# Patient Record
Sex: Female | Born: 1990 | Race: Black or African American | Hispanic: No | Marital: Single | Smoking: Never smoker
Health system: Southern US, Community
[De-identification: ages and names within clinical notes are randomized; demographics above are authoritative.]

## PROBLEM LIST (undated history)

## (undated) ENCOUNTER — Emergency Department (HOSPITAL_COMMUNITY): Admission: EM | Payer: Medicaid Other | Source: Home / Self Care

## (undated) DIAGNOSIS — Z8742 Personal history of other diseases of the female genital tract: Secondary | ICD-10-CM

## (undated) DIAGNOSIS — Z8669 Personal history of other diseases of the nervous system and sense organs: Secondary | ICD-10-CM

## (undated) DIAGNOSIS — N83202 Unspecified ovarian cyst, left side: Secondary | ICD-10-CM

## (undated) DIAGNOSIS — N83201 Unspecified ovarian cyst, right side: Secondary | ICD-10-CM

## (undated) DIAGNOSIS — J45909 Unspecified asthma, uncomplicated: Secondary | ICD-10-CM

## (undated) DIAGNOSIS — I2699 Other pulmonary embolism without acute cor pulmonale: Secondary | ICD-10-CM

## (undated) HISTORY — DX: Other pulmonary embolism without acute cor pulmonale: I26.99

## (undated) HISTORY — DX: Personal history of other diseases of the nervous system and sense organs: Z86.69

## (undated) HISTORY — DX: Personal history of other diseases of the female genital tract: Z87.42

---

## 2013-07-08 HISTORY — PX: OVARIAN CYST REMOVAL: SHX89

## 2017-07-22 ENCOUNTER — Encounter (HOSPITAL_COMMUNITY): Payer: Self-pay | Admitting: *Deleted

## 2017-07-22 ENCOUNTER — Other Ambulatory Visit: Payer: Self-pay

## 2017-07-22 ENCOUNTER — Emergency Department (HOSPITAL_COMMUNITY): Payer: Medicaid Other

## 2017-07-22 ENCOUNTER — Emergency Department (HOSPITAL_COMMUNITY)
Admission: EM | Admit: 2017-07-22 | Discharge: 2017-07-22 | Disposition: A | Discharge status: Home / Self Care | Payer: Medicaid Other | Attending: Emergency Medicine | Admitting: Emergency Medicine

## 2017-07-22 DIAGNOSIS — Y999 Unspecified external cause status: Secondary | ICD-10-CM | POA: Insufficient documentation

## 2017-07-22 DIAGNOSIS — Y9301 Activity, walking, marching and hiking: Secondary | ICD-10-CM | POA: Insufficient documentation

## 2017-07-22 DIAGNOSIS — S8001XA Contusion of right knee, initial encounter: Secondary | ICD-10-CM | POA: Diagnosis not present

## 2017-07-22 DIAGNOSIS — Y929 Unspecified place or not applicable: Secondary | ICD-10-CM | POA: Diagnosis not present

## 2017-07-22 DIAGNOSIS — S8991XA Unspecified injury of right lower leg, initial encounter: Secondary | ICD-10-CM | POA: Diagnosis present

## 2017-07-22 DIAGNOSIS — X509XXA Other and unspecified overexertion or strenuous movements or postures, initial encounter: Secondary | ICD-10-CM | POA: Insufficient documentation

## 2017-07-22 NOTE — ED Notes (Signed)
ED Provider at bedside. 

## 2017-07-22 NOTE — ED Notes (Signed)
Patient transported to MRI 

## 2017-07-22 NOTE — Discharge Instructions (Signed)
Use ice 3-4 times daily alternating 20 minutes on, 20 minutes off.  Keep your leg elevated whenever you are not walking on it.  Please follow-up with Dr. August Saucerean as soon as possible for further evaluation and treatment of your injury.  You can take ibuprofen and Tylenol as prescribed over-the-counter, alternating as needed for pain.  Wear the knee immobilizer as needed for comfort.  Please return to emergency department if you develop any new or worsening symptoms.

## 2017-07-22 NOTE — Consult Note (Signed)
Reason for Consult:Right knee pain Referring Physician: E Aliene AltesWentz  Debra Bell is an 27 y.o. female.  HPI: Debra Bell was going up a flight of stairs 2 at a time when she missed the last one and fell onto her right knee on the landing. This was on Saturday 10d ago. It originally didn't hurt a lot but as time went on it began to hurt more, swell, and bruise. She sought care today because her husband said she should. She has been able to walk on it.  History reviewed. No pertinent past medical history.  History reviewed. No pertinent surgical history.  No family history on file.  Social History:  has no tobacco, alcohol, and drug history on file.  Allergies: No Known Allergies  Medications: I have reviewed the patient's current medications.  No results found for this or any previous visit (from the past 48 hour(s)).  Dg Tibia/fibula Right  Result Date: 07/22/2017 CLINICAL DATA:  Fall.  Swelling of the proximal lower leg. EXAM: RIGHT TIBIA AND FIBULA - 2 VIEW COMPARISON:  Right knee x-rays from same date. FINDINGS: There is no evidence of fracture or other focal bone lesions. Mild soft tissue swelling along the anterior proximal lower leg. IMPRESSION: Mild soft tissue swelling along the anterior proximal lower leg. No acute osseous abnormality. Electronically Signed   By: Obie DredgeWilliam T Derry M.D.   On: 07/22/2017 10:34   Dg Knee Complete 4 Views Right  Result Date: 07/22/2017 CLINICAL DATA:  Onset of right anterior knee pain 9 days ago at which time the patient fell and struck the anterior aspect of the knee on the front corner of a stepping stone. EXAM: RIGHT KNEE - COMPLETE 4+ VIEW COMPARISON:  None in PACs FINDINGS: There is an abnormal appearance of the lower pole of the patella. This may reflect an avulsion fracture fracture or fracture through an enthesiophyte. The patella appears appropriately positioned. The patellofemoral joint space is reasonably well-maintained. The distal femur and  proximal tibia and fibula are unremarkable. The medial and lateral joint spaces are well maintained. There is no significant joint effusion. IMPRESSION: The patient has sustained a fracture through the lower pole of the patella or through an enthesiophyte arising from the lower pole of the patella at the origin of the patellar tendon. The remainder of the knee is unremarkable. Electronically Signed   By: David  SwazilandJordan M.D.   On: 07/22/2017 08:54    Review of Systems  Constitutional: Negative for weight loss.  HENT: Negative for ear discharge, ear pain, hearing loss and tinnitus.   Eyes: Negative for blurred vision, double vision, photophobia and pain.  Respiratory: Negative for cough, sputum production and shortness of breath.   Cardiovascular: Negative for chest pain.  Gastrointestinal: Negative for abdominal pain, nausea and vomiting.  Genitourinary: Negative for dysuria, flank pain, frequency and urgency.  Musculoskeletal: Positive for joint pain (Right knee). Negative for back pain, falls, myalgias and neck pain.  Neurological: Negative for dizziness, tingling, sensory change, focal weakness, loss of consciousness and headaches.  Endo/Heme/Allergies: Does not bruise/bleed easily.  Psychiatric/Behavioral: Negative for depression, memory loss and substance abuse. The patient is not nervous/anxious.    Blood pressure 116/71, pulse 86, temperature 97.9 F (36.6 C), temperature source Oral, resp. rate 17, height 5\' 5"  (1.651 m), weight 127 kg (280 lb), last menstrual period 06/30/2017, SpO2 100 %. Physical Exam  Constitutional: She appears well-developed and well-nourished. No distress.  HENT:  Head: Normocephalic.  Eyes: Conjunctivae are normal. Right eye exhibits no  discharge. Left eye exhibits no discharge. No scleral icterus.  Neck: Normal range of motion.  Cardiovascular: Normal rate and regular rhythm.  Respiratory: Effort normal. No respiratory distress.  Musculoskeletal:  RLE No  traumatic wounds or rash, extensive ecchymoses inferomedial to knee with dependent ecchymoses down leg.  Mod-to-severe TTP med/lat joint lines, mild TTP patella  No obvious knee or ankle effusion but body habitus makes assessment difficult  Knee stable to varus/ valgus and anterior/posterior stress but limited by pain  Sens DPN, SPN, TN intact  Motor EHL, ext, flex, evers 5/5  DP 2+, PT 2+, No significant edema  Neurological: She is alert.  Skin: Skin is warm and dry. She is not diaphoretic.  Psychiatric: She has a normal mood and affect. Her behavior is normal.    Assessment/Plan: Right knee pain -- Given history and x-ray doubt that represents a true patella fx but I do think something is going on in her knee. Will get MRI and assess from there.    Freeman Caldron, PA-C Orthopedic Surgery [phone] 07/22/2017, 11:41 AM

## 2017-07-22 NOTE — ED Provider Notes (Signed)
[facility] EMERGENCY DEPARTMENT Provider Note   CSN: [id number] Arrival date & time: 07/22/17  0759     History   Chief Complaint Chief Complaint  Patient presents with  . Leg Injury    HPI Debra Bell is a 27 y.o. female who is previously healthy who presents with right knee and lower leg pain after falling almost a week ago.  She reports she tripped on a step and hit her knee on the step edge.  She denied her head or lose consciousness.  She developed worsening pain, bruising, and swelling to her leg a couple days after the injury.  She has been ambulating on the leg with mild to moderate pain.  She has had some associated tingling in the leg.  She denies any other associated symptoms.  HPI  History reviewed. No pertinent past medical history.  There are no active problems to display for this patient.   History reviewed. No pertinent surgical history.  OB History    No data available       Home Medications    Prior to Admission medications   Not on File    Family History No family history on file.  Social History Social History   Tobacco Use  . Smoking status: Not on file  Substance Use Topics  . Alcohol use: Not on file  . Drug use: Not on file     Allergies   Patient has no known allergies.   Review of Systems Review of Systems  Musculoskeletal: Positive for arthralgias (R knee and lower leg).  Skin: Positive for color change.  Neurological: Positive for numbness (paresthesia).     Physical Exam Updated Vital Signs BP 104/62 (BP Location: Right Arm)   Pulse 78   Temp 97.9 F (36.6 C) (Oral)   Resp 18   Ht 5\' 5"  (1.651 m)   Wt 127 kg (280 lb)   LMP 06/30/2017   SpO2 100%   BMI 46.59 kg/m   Physical Exam  Constitutional: She appears well-developed and well-nourished. No distress.  HENT:  Head: Normocephalic and atraumatic.  Mouth/Throat: Oropharynx is clear and moist. No oropharyngeal exudate.  Eyes:  Conjunctivae are normal. Pupils are equal, round, and reactive to light. Right eye exhibits no discharge. Left eye exhibits no discharge. No scleral icterus.  Neck: Normal range of motion. Neck supple. No thyromegaly present.  Cardiovascular: Normal rate, regular rhythm, normal heart sounds and intact distal pulses. Exam reveals no gallop and no friction rub.  No murmur heard. Pulmonary/Chest: Effort normal and breath sounds normal. No stridor. No respiratory distress. She has no wheezes. She has no rales.  Abdominal: Soft. Bowel sounds are normal.  Musculoskeletal: She exhibits no edema.  R knee: Significant ecchymosis noted inferior to right knee with indurated hematoma to the medial aspect just inferior to the knee; some crepitus noted to palpation of the patella with some associated tenderness; ecchymosis extends to the ankle; tenderness to the anterior ankle and anterior lower leg, but no tenderness to bilateral malleoli; sensation is intact; range of motion intact; no laxity noted with varus and valgus stress; negative anterior and posterior drawer test  Lymphadenopathy:    She has no cervical adenopathy.  Neurological: She is alert. Coordination normal.  Skin: Skin is warm and dry. No rash noted. She is not diaphoretic. No pallor.  Psychiatric: She has a normal mood and affect.  Nursing note and vitals reviewed.    ED Treatments / Results  Labs (all  labs ordered are listed, but only abnormal results are displayed) Labs Reviewed - No data to display  EKG  EKG Interpretation None       Radiology Dg Tibia/fibula Right  Result Date: 07/22/2017 CLINICAL DATA:  Fall.  Swelling of the proximal lower leg. EXAM: RIGHT TIBIA AND FIBULA - 2 VIEW COMPARISON:  Right knee x-rays from same date. FINDINGS: There is no evidence of fracture or other focal bone lesions. Mild soft tissue swelling along the anterior proximal lower leg. IMPRESSION: Mild soft tissue swelling along the anterior  proximal lower leg. No acute osseous abnormality. Electronically Signed   By: Obie Dredge M.D.   On: 07/22/2017 10:34   Mr Knee Right Wo Contrast  Result Date: 07/22/2017 CLINICAL DATA:  Right knee injury on stairs 07/12/2017 with a possible fracture. EXAM: MRI OF THE RIGHT KNEE WITHOUT CONTRAST TECHNIQUE: Multiplanar, multisequence MR imaging of the knee was performed. No intravenous contrast was administered. COMPARISON:  None. FINDINGS: MENISCI Medial meniscus:  Intact. Lateral meniscus:  Intact. LIGAMENTS Cruciates:  Intact. Collaterals:  Intact. CARTILAGE Patellofemoral:  Normal. Medial:  Normal. Lateral:  Normal. Joint:  No effusion. Popliteal Fossa:  No Baker's cyst. Extensor Mechanism: Intact. An ossific fragment is seen in the superior fibers of the patellar tendon consistent with remote tendinopathy or injury. Bones:  No fracture. Other: Subcutaneous edema is seen anterior to the patellar tendon and proximal tibia consistent with contusion. IMPRESSION: Negative for fracture. Ossification in the superior fibers of the patellar tendon is most consistent with remote tendinopathy or injury and accounts for finding on comparison plain films. Subcutaneous edema anterior to the patellar tendon and proximal tibia consistent with contusion. Negative for meniscal or ligament tear. Electronically Signed   By: Drusilla Kanner M.D.   On: 07/22/2017 15:07   Dg Knee Complete 4 Views Right  Result Date: 07/22/2017 CLINICAL DATA:  Onset of right anterior knee pain 9 days ago at which time the patient fell and struck the anterior aspect of the knee on the front corner of a stepping stone. EXAM: RIGHT KNEE - COMPLETE 4+ VIEW COMPARISON:  None in PACs FINDINGS: There is an abnormal appearance of the lower pole of the patella. This may reflect an avulsion fracture fracture or fracture through an enthesiophyte. The patella appears appropriately positioned. The patellofemoral joint space is reasonably  well-maintained. The distal femur and proximal tibia and fibula are unremarkable. The medial and lateral joint spaces are well maintained. There is no significant joint effusion. IMPRESSION: The patient has sustained a fracture through the lower pole of the patella or through an enthesiophyte arising from the lower pole of the patella at the origin of the patellar tendon. The remainder of the knee is unremarkable. Electronically Signed   By: David  [place] M.D.   On: 07/22/2017 08:54    Procedures Procedures (including critical care time)  Medications Ordered in ED Medications - No data to display   Initial Impression / Assessment and Plan / ED Course  I have reviewed the triage vital signs and the nursing notes.  Pertinent labs & imaging results that were available during my care of the patient were reviewed by me and considered in my medical decision making (see chart for details).     Patient with right knee pain after direct injury about 1 week ago.  X-ray showed fracture through the lower pole of the patella or through enthesophyte arising from the lower pole.  Otherwise, x-ray unremarkable.  I consulted Earney Hamburg, orthopedic  PA, who evaluate the patient and recommended MRI while patient was in the department.  MRI shows no fracture, but ossification in the superior fibers of the patellar tendon most consistent with remote tendinopathy or injury and accounts for finding on the comparison x-ray; subcutaneous edema anterior to the patellar tendon and proximal tibia consistent with contusion; negative for meniscal or ligament tear.  Patient will be placed in knee immobilizer for comfort with weightbearing as tolerated.  Follow-up to orthopedics for further management.  Supportive treatment discussed including ice, elevation, ibuprofen and Tylenol.  Return precautions discussed.  Patient understands and agrees with plan.  Patient vitals stable throughout ED course and discharged in  satisfactory condition.  Final Clinical Impressions(s) / ED Diagnoses   Final diagnoses:  Contusion of right knee, initial encounter    ED Discharge Orders    None       Emi HolesLaw, Javel Hersh M, PA-C 07/22/17 1741    Mancel BaleWentz, Elliott, MD 07/23/17 [phone]

## 2017-07-22 NOTE — ED Triage Notes (Signed)
Pt reports tripping and hitting her leg on Sunday. Pt states that she has a bruise, swelling  and knot to her rt knee and leg. Pt states that she is able to bear weight but with pain.

## 2017-12-18 ENCOUNTER — Encounter (HOSPITAL_COMMUNITY): Payer: Self-pay | Admitting: Emergency Medicine

## 2017-12-18 ENCOUNTER — Ambulatory Visit (HOSPITAL_COMMUNITY)
Admission: EM | Admit: 2017-12-18 | Discharge: 2017-12-18 | Disposition: A | Discharge status: Home / Self Care | Payer: Medicaid Other | Attending: Family Medicine | Admitting: Family Medicine

## 2017-12-18 ENCOUNTER — Ambulatory Visit (INDEPENDENT_AMBULATORY_CARE_PROVIDER_SITE_OTHER): Payer: Medicaid Other

## 2017-12-18 DIAGNOSIS — J22 Unspecified acute lower respiratory infection: Secondary | ICD-10-CM | POA: Insufficient documentation

## 2017-12-18 DIAGNOSIS — R05 Cough: Secondary | ICD-10-CM | POA: Diagnosis present

## 2017-12-18 LAB — POCT RAPID STREP A: Streptococcus, Group A Screen (Direct): NEGATIVE

## 2017-12-18 MED ORDER — AZITHROMYCIN 250 MG PO TABS: ORAL_TABLET | ORAL | 0 refills | Status: AC

## 2017-12-18 MED ORDER — IBUPROFEN 400 MG PO TABS: 400.0000 mg | ORAL_TABLET | Freq: Four times a day (QID) | ORAL | 0 refills | Status: DC | PRN

## 2017-12-18 MED ORDER — ACETAMINOPHEN 325 MG PO TABS
ORAL_TABLET | ORAL | Status: AC
  Filled 2017-12-18: qty 1

## 2017-12-18 MED ORDER — ACETAMINOPHEN 325 MG PO TABS
650.0000 mg | ORAL_TABLET | Freq: Once | ORAL | Status: AC
  Administered 2017-12-18: 650 mg via ORAL

## 2017-12-18 NOTE — ED Triage Notes (Signed)
Pt c/o cough, blood in her  Sputum. Since yesterday.

## 2017-12-18 NOTE — Discharge Instructions (Signed)
Push fluids to ensure adequate hydration and keep secretions thin.  Tylenol and/or ibuprofen as needed for pain or fevers.  Complete course of antibiotics.  Over the counter treatments as needed for symptoms. If symptoms worsen or do not improve in the next week to return to be seen or to follow up with your PCP.

## 2017-12-18 NOTE — ED Provider Notes (Signed)
[facility]    CSN: [id number] Arrival date & time: 12/18/17  1019     History   Chief Complaint Chief Complaint  Patient presents with  . Cough    HPI Debra Bell is a 27 y.o. female.   Debra Bell presents with complaints of cough, sore throat, fever, nausea, blood production with cough. Started last night with cough and woke this morning worse. No vomiting or diarrhea. Body aches. No ear pain. Took a cough drop but no other medications for symptoms. Chest pain  With cough. Travelled to [place] 4 weeks ago, otherwise no recent travel. States past history of asthma but has not had an attack in approximately 3 years. No rash. Her son has had URI. LMP 6/5.   ROS per HPI.      History reviewed. No pertinent past medical history.  There are no active problems to display for this patient.   History reviewed. No pertinent surgical history.  OB History   None      Home Medications    Prior to Admission medications   Medication Sig Start Date End Date Taking? Authorizing Provider  azithromycin (ZITHROMAX) 250 MG tablet Take 2 tablets (500 mg total) by mouth daily for 1 day, THEN 1 tablet (250 mg total) daily for 4 days. 12/18/17 12/23/17  Georgetta HaberBurky, Tykee Heideman B, NP  ibuprofen (ADVIL,MOTRIN) 400 MG tablet Take 1 tablet (400 mg total) by mouth every 6 (six) hours as needed. 12/18/17   Georgetta HaberBurky, Natoria Archibald B, NP    Family History No family history on file.  Social History Social History   Tobacco Use  . Smoking status: Not on file  Substance Use Topics  . Alcohol use: Not on file  . Drug use: Not on file     Allergies   Patient has no known allergies.   Review of Systems Review of Systems   Physical Exam Triage Vital Signs ED Triage Vitals [12/18/17 1106]  Enc Vitals Group     BP 122/85     Pulse Rate (!) 105     Resp 18     Temp (!) 102.3 F (39.1 C)     Temp Source Oral     SpO2 98 %     Weight      Height      Head Circumference      Peak Flow    Pain Score      Pain Loc      Pain Edu?      Excl. in GC?    No data found.  Updated Vital Signs BP 122/85   Pulse (!) 105   Temp (!) 102.3 F (39.1 C) (Oral)   Resp 18   LMP 12/10/2017   SpO2 98%    Physical Exam  Constitutional: She is oriented to person, place, and time. She appears well-developed and well-nourished. No distress.  HENT:  Head: Normocephalic and atraumatic.  Right Ear: External ear and ear canal normal.  Left Ear: Tympanic membrane, external ear and ear canal normal.  Nose: Nose normal.  Mouth/Throat: Uvula is midline, oropharynx is clear and moist and mucous membranes are normal. Tonsils are 1+ on the right. Tonsils are 1+ on the left. Tonsillar exudate.  Right ear with large cerumen   Eyes: Pupils are equal, round, and reactive to light. Conjunctivae and EOM are normal.  Cardiovascular: Normal rate, regular rhythm and normal heart sounds.  Pulmonary/Chest: Effort normal and breath sounds normal.  Abdominal: Soft. There is no tenderness.  Neurological: She is alert and oriented to person, place, and time.  Skin: Skin is warm and dry.     UC Treatments / Results  Labs (all labs ordered are listed, but only abnormal results are displayed) Labs Reviewed  CULTURE, GROUP A STREP [facility])  POCT RAPID STREP A    EKG None  Radiology Dg Chest 2 View  Result Date: 12/18/2017 CLINICAL DATA:  27 year old female with sore throat and cough onset last night. Blood tinged sputum this morning. Fever 102.3, tachycardia. EXAM: CHEST - 2 VIEW COMPARISON:  None. FINDINGS: Low normal lung volumes. Normal cardiac size and mediastinal contours. Visualized tracheal air column is within normal limits. No pneumothorax, pulmonary edema, pleural effusion or confluent pulmonary opacity. Borderline to mild increased pulmonary interstitial markings, might reflect crowding. No osseous abnormality identified. Negative visible bowel gas pattern. IMPRESSION: Negative aside from  borderline to mild increased interstitial markings which could reflect atelectasis, but consider viral/atypical respiratory infection. Electronically Signed   By: Odessa Fleming M.D.   On: 12/18/2017 11:35    Procedures Procedures (including critical care time)  Medications Ordered in UC Medications  acetaminophen (TYLENOL) tablet 650 mg (650 mg Oral Given 12/18/17 1110)    Initial Impression / Assessment and Plan / UC Course  I have reviewed the triage vital signs and the nursing notes.  Pertinent labs & imaging results that were available during my care of the patient were reviewed by me and considered in my medical decision making (see chart for details).     Xray with atypical vs viral findings. Patient febrile with chest pain  And cough. Will treat empirically with azithromycin at this time. Discussed results vs viral illness with patient. Continue with supportive cares. Return precautions provided. Patient verbalized understanding and agreeable to plan.  Ambulatory out of clinic without difficulty.    Final Clinical Impressions(s) / UC Diagnoses   Final diagnoses:  Lower respiratory tract infection     Discharge Instructions     Push fluids to ensure adequate hydration and keep secretions thin.  Tylenol and/or ibuprofen as needed for pain or fevers.  Complete course of antibiotics.  Over the counter treatments as needed for symptoms. If symptoms worsen or do not improve in the next week to return to be seen or to follow up with your PCP.      ED Prescriptions    Medication Sig Dispense Auth. Provider   azithromycin (ZITHROMAX) 250 MG tablet Take 2 tablets (500 mg total) by mouth daily for 1 day, THEN 1 tablet (250 mg total) daily for 4 days. 6 tablet Linus Mako B, NP   ibuprofen (ADVIL,MOTRIN) 400 MG tablet Take 1 tablet (400 mg total) by mouth every 6 (six) hours as needed. 30 tablet Georgetta Haber, NP     Controlled Substance Prescriptions [place] Controlled Substance  Registry consulted? Not Applicable   Georgetta Haber, NP 12/18/17 1150

## 2017-12-20 ENCOUNTER — Telehealth (HOSPITAL_COMMUNITY): Payer: Self-pay

## 2017-12-20 LAB — CULTURE, GROUP A STREP (THRC)

## 2017-12-20 NOTE — Telephone Encounter (Signed)
Culture is positive for non group A Strep germ.  This is a finding of uncertain significance; not the typical 'strep throat' germ.  Attempted to reach patient. No answer at this time.  

## 2018-03-14 ENCOUNTER — Ambulatory Visit (HOSPITAL_COMMUNITY)
Admission: EM | Admit: 2018-03-14 | Discharge: 2018-03-14 | Disposition: A | Discharge status: Home / Self Care | Payer: Medicaid Other | Attending: Internal Medicine | Admitting: Internal Medicine

## 2018-03-14 ENCOUNTER — Encounter (HOSPITAL_COMMUNITY): Payer: Self-pay | Admitting: *Deleted

## 2018-03-14 DIAGNOSIS — G43019 Migraine without aura, intractable, without status migrainosus: Secondary | ICD-10-CM | POA: Diagnosis not present

## 2018-03-14 HISTORY — DX: Unspecified asthma, uncomplicated: J45.909

## 2018-03-14 MED ORDER — DEXAMETHASONE SODIUM PHOSPHATE 10 MG/ML IJ SOLN
INTRAMUSCULAR | Status: AC
  Filled 2018-03-14: qty 1

## 2018-03-14 MED ORDER — METOCLOPRAMIDE HCL 5 MG/ML IJ SOLN
INTRAMUSCULAR | Status: AC
  Filled 2018-03-14: qty 2

## 2018-03-14 MED ORDER — DIPHENHYDRAMINE HCL 50 MG/ML IJ SOLN
25.0000 mg | Freq: Once | INTRAMUSCULAR | Status: AC
  Administered 2018-03-14: 25 mg via INTRAMUSCULAR

## 2018-03-14 MED ORDER — METOCLOPRAMIDE HCL 5 MG/ML IJ SOLN
5.0000 mg | Freq: Once | INTRAMUSCULAR | Status: AC
  Administered 2018-03-14: 5 mg via INTRAMUSCULAR

## 2018-03-14 MED ORDER — KETOROLAC TROMETHAMINE 60 MG/2ML IM SOLN
60.0000 mg | Freq: Once | INTRAMUSCULAR | Status: AC
  Administered 2018-03-14: 60 mg via INTRAMUSCULAR

## 2018-03-14 MED ORDER — DIPHENHYDRAMINE HCL 50 MG/ML IJ SOLN
INTRAMUSCULAR | Status: AC
  Filled 2018-03-14: qty 1

## 2018-03-14 MED ORDER — DEXAMETHASONE SODIUM PHOSPHATE 10 MG/ML IJ SOLN
10.0000 mg | Freq: Once | INTRAMUSCULAR | Status: AC
  Administered 2018-03-14: 10 mg via INTRAMUSCULAR

## 2018-03-14 MED ORDER — KETOROLAC TROMETHAMINE 60 MG/2ML IM SOLN
INTRAMUSCULAR | Status: AC
  Filled 2018-03-14: qty 2

## 2018-03-14 NOTE — ED Triage Notes (Signed)
Pt states she has a headache that started Wednesday night. Patient states that her headache is aggravated by light and eye movement.

## 2018-03-14 NOTE — Discharge Instructions (Signed)
It was nice meeting you!!  We will treat with a migraine cocktail.  Follow up as needed for continued or worsening symptoms

## 2018-03-16 NOTE — ED Provider Notes (Addendum)
[facility]    CSN: [id number] Arrival date & time: 03/14/18  1749     History   Chief Complaint Chief Complaint  Patient presents with  . Headache    HPI Debra Bell is a 27 y.o. female.    Headache  Pain location:  L parietal Quality:  Dull (pulsating) Radiates to:  Does not radiate Severity at highest:  10/10 Onset quality:  Gradual Duration:  4 days Timing:  Constant Progression:  Unchanged Chronicity:  Recurrent Similar to prior headaches: yes   Context: activity, bright light and loud noise   Relieved by:  Resting in a darkened room (sleep but returns every morning upon wakening. ) Worsened by:  Light, activity and sound Associated symptoms: blurred vision, nausea, photophobia and visual change   Associated symptoms: no congestion, no cough, no dizziness, no facial pain, no fever, no focal weakness, no neck stiffness, no numbness, no paresthesias, no sinus pressure, no vomiting and no weakness     Past Medical History:  Diagnosis Date  . Asthma     There are no active problems to display for this patient.   Past Surgical History:  Procedure Laterality Date  . OVARIAN CYST REMOVAL  2015   and 2013    OB History   None      Home Medications    Prior to Admission medications   Medication Sig Start Date End Date Taking? Authorizing Provider  aspirin-acetaminophen-caffeine (EXCEDRIN MIGRAINE) [phone] MG tablet Take 2 tablets by mouth every 6 (six) hours as needed for headache.   Yes Emergency, Nurse, RN  ibuprofen (ADVIL,MOTRIN) 400 MG tablet Take 1 tablet (400 mg total) by mouth every 6 (six) hours as needed. 12/18/17   Georgetta Haber, NP    Family History History reviewed. No pertinent family history.  Social History Social History   Tobacco Use  . Smoking status: Never Smoker  . Smokeless tobacco: Never Used  Substance Use Topics  . Alcohol use: Not on file  . Drug use: Not on file     Allergies   Patient has no  known allergies.   Review of Systems Review of Systems  Constitutional: Negative for fever.  HENT: Negative for congestion and sinus pressure.   Eyes: Positive for blurred vision and photophobia.  Respiratory: Negative for cough.   Gastrointestinal: Positive for nausea. Negative for vomiting.  Musculoskeletal: Negative for neck stiffness.  Neurological: Positive for headaches. Negative for dizziness, focal weakness, weakness, numbness and paresthesias.     Physical Exam Triage Vital Signs ED Triage Vitals  Enc Vitals Group     BP 03/14/18 1817 108/69     Pulse Rate 03/14/18 1814 75     Resp 03/14/18 1814 18     Temp 03/14/18 1814 98 F (36.7 C)     Temp Source 03/14/18 1814 Oral     SpO2 03/14/18 1814 100 %     Weight --      Height --      Head Circumference --      Peak Flow --      Pain Score 03/14/18 1814 5     Pain Loc --      Pain Edu? --      Excl. in GC? --    No data found.  Updated Vital Signs BP 108/69   Pulse 75   Temp 98 F (36.7 C) (Oral)   Resp 18   LMP 02/23/2018   SpO2 100%   Visual Acuity  Right Eye Distance:   Left Eye Distance:   Bilateral Distance:    Right Eye Near:   Left Eye Near:    Bilateral Near:     Physical Exam  Constitutional: She is oriented to person, place, and time. She appears well-developed and well-nourished.  Very pleasant. Non toxic or ill appearing.  Appears to be in pain  HENT:  Head: Normocephalic and atraumatic.  Eyes: Pupils are equal, round, and reactive to light. EOM are normal. Right eye exhibits no nystagmus. Left eye exhibits no nystagmus.  Neck: Normal range of motion.  Pulmonary/Chest: Effort normal.  Musculoskeletal: Normal range of motion.  Lymphadenopathy:    She has no cervical adenopathy.  Neurological: She is alert and oriented to person, place, and time. She has normal strength. She is not disoriented. No cranial nerve deficit or sensory deficit.  No focal neuro deficits. Cranial nerves  intact. Strength 5/5 in all extremities.   Skin: Skin is warm and dry. Capillary refill takes less than 2 seconds.  Psychiatric: She has a normal mood and affect.  Nursing note and vitals reviewed.    UC Treatments / Results  Labs (all labs ordered are listed, but only abnormal results are displayed) Labs Reviewed - No data to display  EKG None  Radiology No results found.  Procedures Procedures (including critical care time)  Medications Ordered in UC Medications  ketorolac (TORADOL) injection 60 mg (60 mg Intramuscular Given 03/14/18 1901)  metoCLOPramide (REGLAN) injection 5 mg (5 mg Intramuscular Given 03/14/18 1901)  dexamethasone (DECADRON) injection 10 mg (10 mg Intramuscular Given 03/14/18 1902)  diphenhydrAMINE (BENADRYL) injection 25 mg (25 mg Intramuscular Given 03/14/18 1901)    Initial Impression / Assessment and Plan / UC Course  I have reviewed the triage vital signs and the nursing notes.  Pertinent labs & imaging results that were available during my care of the patient were reviewed by me and considered in my medical decision making (see chart for details).     Migraine x 4 days- hx of same but usually doesn't last this long.  Goes away with sleeping but returns Will treat with migraine cocktail.  Follow up as needed for continued or worsening symptoms  Final Clinical Impressions(s) / UC Diagnoses   Final diagnoses:  Intractable migraine without aura and without status migrainosus     Discharge Instructions     It was nice meeting you!!  We will treat with a migraine cocktail.  Follow up as needed for continued or worsening symptoms      ED Prescriptions    None     Controlled Substance Prescriptions [place] Controlled Substance Registry consulted? no   Janace Aris, NP 03/16/18 1857    Janace Aris, NP 03/16/18 1904

## 2019-02-26 IMAGING — CR DG TIBIA/FIBULA 2V*R*
3 series · 3 of 3 positions shown · non-contrast
Comparison: Right knee x-rays from same date.

CLINICAL DATA: Fall.  Swelling of the proximal lower leg.

EXAM:
RIGHT TIBIA AND FIBULA - 2 VIEW

[tibia ap (1 of 2)]
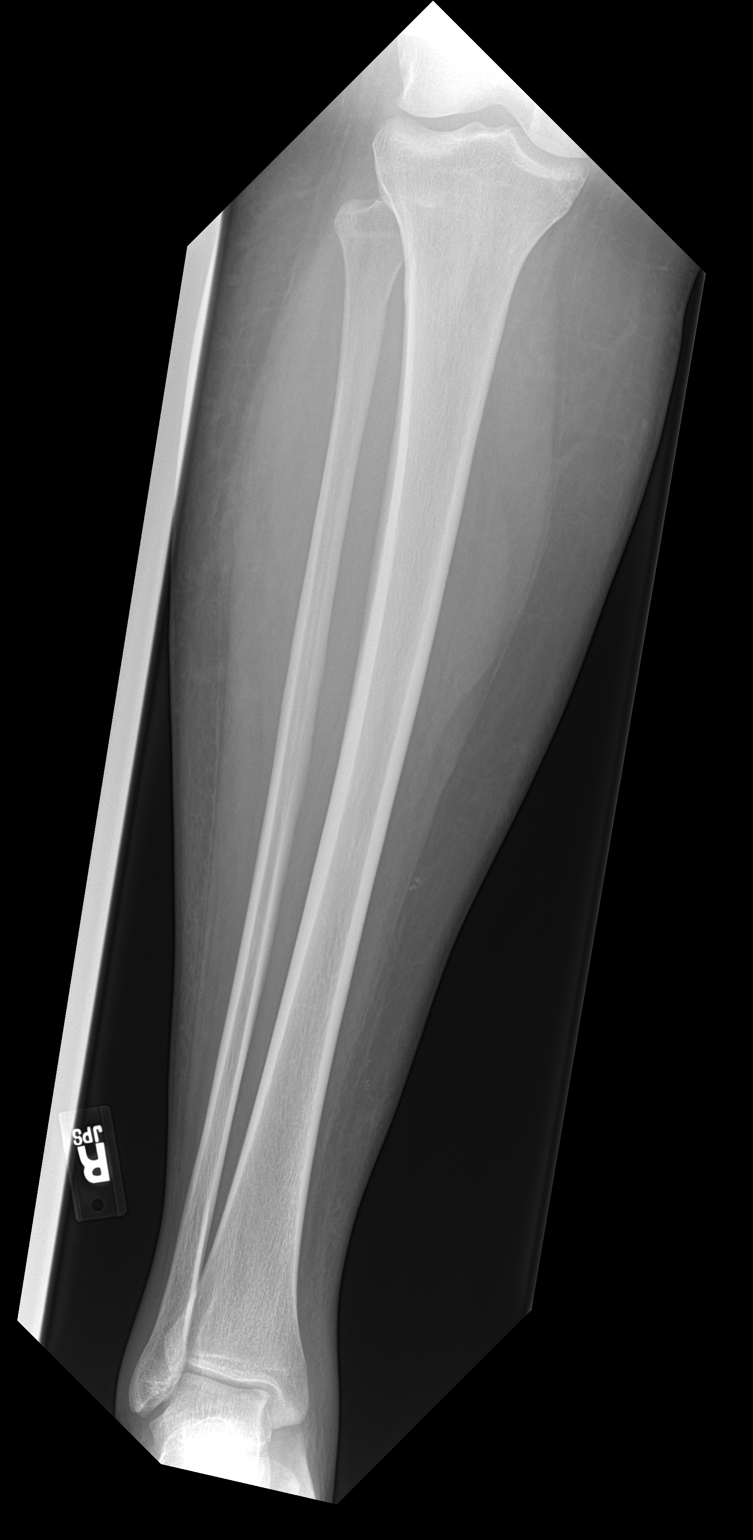

[tibia ap (2 of 2)]
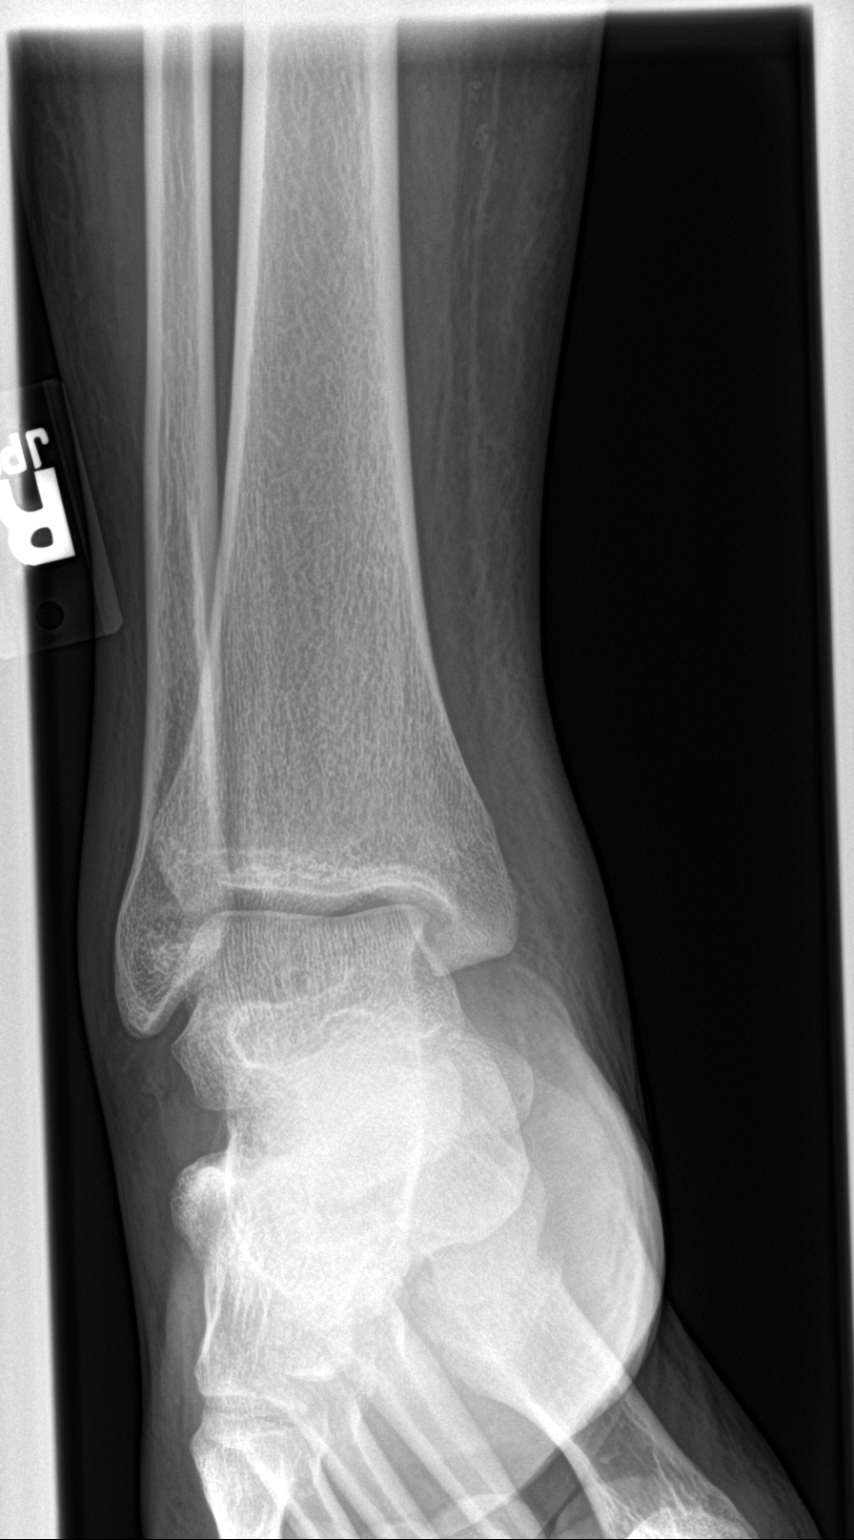

[tibia lat]
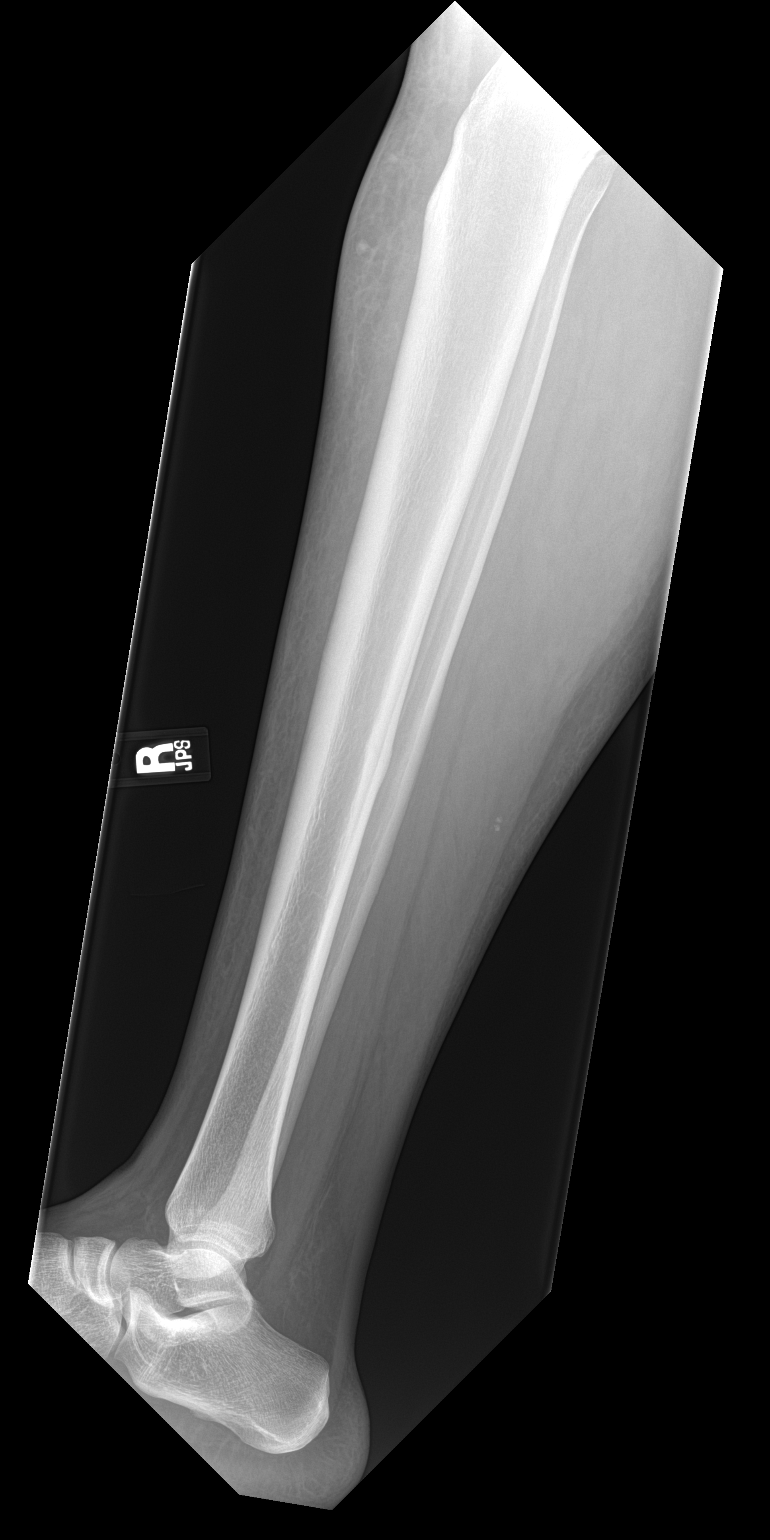

[3 of 3 positions shown; findings below may reference images not displayed]

FINDINGS: There is no evidence of fracture or other focal bone lesions. Mild
soft tissue swelling along the anterior proximal lower leg.
IMPRESSION: Mild soft tissue swelling along the anterior proximal lower leg. No
acute osseous abnormality.

## 2019-07-16 ENCOUNTER — Other Ambulatory Visit: Payer: Self-pay

## 2019-07-16 ENCOUNTER — Encounter (HOSPITAL_COMMUNITY): Payer: Self-pay | Admitting: Family Medicine

## 2019-07-16 ENCOUNTER — Ambulatory Visit (HOSPITAL_COMMUNITY)
Admission: EM | Admit: 2019-07-16 | Discharge: 2019-07-16 | Disposition: A | Discharge status: Home / Self Care | Payer: Medicaid Other | Attending: Family Medicine | Admitting: Family Medicine

## 2019-07-16 DIAGNOSIS — M436 Torticollis: Secondary | ICD-10-CM

## 2019-07-16 MED ORDER — PREDNISONE 50 MG PO TABS: ORAL_TABLET | ORAL | 0 refills | Status: DC

## 2019-07-16 MED ORDER — OXYCODONE-ACETAMINOPHEN 5-325 MG PO TABS: 1.0000 | ORAL_TABLET | Freq: Three times a day (TID) | ORAL | 0 refills | Status: DC | PRN

## 2019-07-16 NOTE — ED Provider Notes (Signed)
[facility]    CSN: [id number] Arrival date & time: 07/16/19  1124      History   Chief Complaint Chief Complaint  Patient presents with  . Neck stiffness    HPI Debra Bell is a 29 y.o. female.   29 yo established [facility] patient presents with stiff neck.  She states she recently got a new bed & her neck has been stiff since last night.   No fever or arm pain.  Pain worse with looking right.  Not working at [employer] right now.     Past Medical History:  Diagnosis Date  . Asthma     There are no problems to display for this patient.   Past Surgical History:  Procedure Laterality Date  . OVARIAN CYST REMOVAL  2015   and 2013    OB History   No obstetric history on file.      Home Medications    Prior to Admission medications   Medication Sig Start Date End Date Taking? Authorizing Provider  aspirin-acetaminophen-caffeine (EXCEDRIN MIGRAINE) [phone] MG tablet Take 2 tablets by mouth every 6 (six) hours as needed for headache.    Emergency, Nurse, RN  ibuprofen (ADVIL,MOTRIN) 400 MG tablet Take 1 tablet (400 mg total) by mouth every 6 (six) hours as needed. 12/18/17   Georgetta Haber, NP  oxyCODONE-acetaminophen (PERCOCET/ROXICET) 5-325 MG tablet Take 1 tablet by mouth every 8 (eight) hours as needed for severe pain. 07/16/19   Elvina Sidle, MD  predniSONE (DELTASONE) 50 MG tablet One daily with food 07/16/19   Elvina Sidle, MD    Family History History reviewed. No pertinent family history.  Social History Social History   Tobacco Use  . Smoking status: Never Smoker  . Smokeless tobacco: Never Used  Substance Use Topics  . Alcohol use: Never  . Drug use: Never     Allergies   Patient has no known allergies.   Review of Systems Review of Systems  Musculoskeletal: Positive for neck pain.  Neurological: Negative.   All other systems reviewed and are negative.    Physical Exam Triage Vital Signs ED Triage Vitals  Enc Vitals  Group     BP      Pulse      Resp      Temp      Temp src      SpO2      Weight      Height      Head Circumference      Peak Flow      Pain Score      Pain Loc      Pain Edu?      Excl. in GC?    No data found.  Updated Vital Signs BP 127/90 (BP Location: Right Arm)   Pulse 83   Temp 98.4 F (36.9 C) (Oral)   Resp 18   Wt 104.3 kg   LMP 06/30/2019   SpO2 99%   BMI 38.27 kg/m    Physical Exam Vitals and nursing note reviewed.  Constitutional:      General: She is in acute distress.     Appearance: Normal appearance. She is obese. She is not ill-appearing or toxic-appearing.  HENT:     Head: Normocephalic.  Eyes:     Conjunctiva/sclera: Conjunctivae normal.  Neck:     Comments: Pain with right head rotation more than lift  nontender neck  FROM of arms. Pulmonary:     Effort: Pulmonary effort  is normal.  Musculoskeletal:        General: Normal range of motion.  Skin:    General: Skin is warm and dry.  Neurological:     General: No focal deficit present.     Mental Status: She is alert and oriented to person, place, and time.     Sensory: No sensory deficit.     Motor: No weakness.     Coordination: Coordination normal.  Psychiatric:        Mood and Affect: Mood normal.        Behavior: Behavior normal.        Thought Content: Thought content normal.        Judgment: Judgment normal.      UC Treatments / Results  Labs (all labs ordered are listed, but only abnormal results are displayed) Labs Reviewed - No data to display  EKG   Radiology No results found.  Procedures Procedures (including critical care time)  Medications Ordered in UC Medications - No data to display  Initial Impression / Assessment and Plan / UC Course  I have reviewed the triage vital signs and the nursing notes.  Pertinent labs & imaging results that were available during my care of the patient were reviewed by me and considered in my medical decision making (see  chart for details).     Final Clinical Impressions(s) / UC Diagnoses   Final diagnoses:  Torticollis, acute   Discharge Instructions   None    ED Prescriptions    Medication Sig Dispense Auth. Provider   predniSONE (DELTASONE) 50 MG tablet One daily with food 3 tablet Robyn Haber, MD   oxyCODONE-acetaminophen (PERCOCET/ROXICET) 5-325 MG tablet Take 1 tablet by mouth every 8 (eight) hours as needed for severe pain. 5 tablet Robyn Haber, MD     I have reviewed the PDMP during this encounter.   Robyn Haber, MD 07/16/19 1229

## 2019-07-16 NOTE — ED Triage Notes (Signed)
Pt. States she recently got a new bed & her neck has been stiff since last night.

## 2020-01-06 DIAGNOSIS — Z419 Encounter for procedure for purposes other than remedying health state, unspecified: Secondary | ICD-10-CM | POA: Diagnosis not present

## 2020-02-06 DIAGNOSIS — Z419 Encounter for procedure for purposes other than remedying health state, unspecified: Secondary | ICD-10-CM | POA: Diagnosis not present

## 2020-03-08 DIAGNOSIS — Z419 Encounter for procedure for purposes other than remedying health state, unspecified: Secondary | ICD-10-CM | POA: Diagnosis not present

## 2020-04-07 DIAGNOSIS — Z419 Encounter for procedure for purposes other than remedying health state, unspecified: Secondary | ICD-10-CM | POA: Diagnosis not present

## 2020-05-08 DIAGNOSIS — Z419 Encounter for procedure for purposes other than remedying health state, unspecified: Secondary | ICD-10-CM | POA: Diagnosis not present

## 2020-06-07 DIAGNOSIS — Z419 Encounter for procedure for purposes other than remedying health state, unspecified: Secondary | ICD-10-CM | POA: Diagnosis not present

## 2020-07-08 DIAGNOSIS — Z419 Encounter for procedure for purposes other than remedying health state, unspecified: Secondary | ICD-10-CM | POA: Diagnosis not present

## 2020-07-10 ENCOUNTER — Ambulatory Visit (HOSPITAL_COMMUNITY)
Admission: EM | Admit: 2020-07-10 | Discharge: 2020-07-10 | Disposition: A | Discharge status: Home / Self Care | Payer: Medicaid Other | Attending: Family Medicine | Admitting: Family Medicine

## 2020-07-10 ENCOUNTER — Encounter (HOSPITAL_COMMUNITY): Payer: Self-pay

## 2020-07-10 ENCOUNTER — Other Ambulatory Visit: Payer: Self-pay

## 2020-07-10 DIAGNOSIS — Z20822 Contact with and (suspected) exposure to covid-19: Secondary | ICD-10-CM | POA: Diagnosis not present

## 2020-07-10 DIAGNOSIS — R112 Nausea with vomiting, unspecified: Secondary | ICD-10-CM | POA: Diagnosis not present

## 2020-07-10 DIAGNOSIS — J069 Acute upper respiratory infection, unspecified: Secondary | ICD-10-CM | POA: Diagnosis not present

## 2020-07-10 DIAGNOSIS — Z3201 Encounter for pregnancy test, result positive: Secondary | ICD-10-CM | POA: Diagnosis not present

## 2020-07-10 LAB — POCT URINALYSIS DIPSTICK, ED / UC
Bilirubin Urine: NEGATIVE
Glucose, UA: NEGATIVE mg/dL
Hgb urine dipstick: NEGATIVE
Ketones, ur: NEGATIVE mg/dL
Leukocytes,Ua: NEGATIVE
Nitrite: NEGATIVE
Protein, ur: NEGATIVE mg/dL
Specific Gravity, Urine: >=1.03 (ref 1.005–1.030)
Urobilinogen, UA: 0.2 mg/dL (ref 0.0–1.0)
pH: 6 (ref 5.0–8.0)

## 2020-07-10 LAB — RESP PANEL BY RT-PCR (FLU A&B, COVID) ARPGX2
Influenza A by PCR: NEGATIVE
Influenza B by PCR: NEGATIVE
SARS Coronavirus 2 by RT PCR: NEGATIVE

## 2020-07-10 LAB — POC URINE PREG, ED: Preg Test, Ur: POSITIVE — AB

## 2020-07-10 MED ORDER — SUCRALFATE 1 G PO TABS: 1.0000 g | ORAL_TABLET | Freq: Three times a day (TID) | ORAL | Status: DC

## 2020-07-10 MED ORDER — ONDANSETRON 4 MG PO TBDP: 4.0000 mg | ORAL_TABLET | Freq: Three times a day (TID) | ORAL | 0 refills | Status: DC | PRN

## 2020-07-10 MED ORDER — PANTOPRAZOLE SODIUM 40 MG PO TBEC: 40.0000 mg | DELAYED_RELEASE_TABLET | Freq: Every day | ORAL | Status: DC

## 2020-07-10 NOTE — Discharge Instructions (Signed)
You have been tested for COVID-19 and influenza. If your test returns positive, you will receive a phone call from [facility] regarding your results. Negative test results are not called. Both positive and negative results area always visible on MyChart. If you do not have a MyChart account, sign up instructions are provided in your discharge papers. Please do not hesitate to contact us should you have questions or concerns.

## 2020-07-10 NOTE — ED Triage Notes (Addendum)
Pt in with c/o nasal congestion, cough, diarrhea, vomiting that has been going on since Thursday.  Pt has been around her sister who recently tested positive for the flu  Pt also requesting pregnancy test for late period

## 2020-07-12 NOTE — ED Provider Notes (Signed)
[facility]   [id number] 07/10/20 Arrival Time: 1115  ASSESSMENT & PLAN:  1. Viral URI with cough   2. Non-intractable vomiting with nausea, unspecified vomiting type   3. Positive pregnancy test      Resp panel testing sent. See letter/work note on file for self-isolation guidelines. OTC symptom care as needed.  Meds ordered this encounter  Medications  . ondansetron (ZOFRAN-ODT) 4 MG disintegrating tablet    Sig: Take 1 tablet (4 mg total) by mouth every 8 (eight) hours as needed for nausea or vomiting.    Dispense:  15 tablet    Refill:  0   Has planned OB f/u.    Follow-up Information    Leilani Able, MD.   Specialty: Family Medicine Why: As needed. Contact information: [address] [place] [address] [phone]               Reviewed expectations re: course of current medical issues. Questions answered. Outlined signs and symptoms indicating need for more acute intervention. Understanding verbalized. After Visit Summary given.   SUBJECTIVE: History from: patient. Debra Bell is a 30 y.o. female who presents with worries regarding COVID-19/influenza. Reports possible exp to influenza; mile URI symptoms currently. Denies: fever and difficulty breathing. Normal PO intake with nausea. Requests UPT. Patient's last menstrual period was 05/08/2020 (approximate).     OBJECTIVE:  Vitals:   07/10/20 1215  BP: (!) 153/115  Pulse: 79  Resp: 20  Temp: 98.4 F (36.9 C)  TempSrc: Oral  SpO2: 98%    General appearance: alert; no distress Eyes: PERRLA; EOMI; conjunctiva normal HENT: [place]; AT; with mild nasal congestion Neck: supple  Lungs: speaks full sentences without difficulty; unlabored Extremities: no edema Skin: warm and dry Neurologic: normal gait Psychological: alert and cooperative; normal mood and affect  Labs: Results for orders placed or performed during the hospital encounter of 07/10/20  Resp Panel by RT-PCR (Flu  A&B, Covid) Nasopharyngeal Swab   Specimen: Nasopharyngeal Swab; Nasopharyngeal(NP) swabs in vial transport medium  Result Value Ref Range   SARS Coronavirus 2 by RT PCR NEGATIVE NEGATIVE   Influenza A by PCR NEGATIVE NEGATIVE   Influenza B by PCR NEGATIVE NEGATIVE  POC Urinalysis dipstick  Result Value Ref Range   Glucose, UA NEGATIVE NEGATIVE mg/dL   Bilirubin Urine NEGATIVE NEGATIVE   Ketones, ur NEGATIVE NEGATIVE mg/dL   Specific Gravity, Urine >=1.030 1.005 - 1.030   Hgb urine dipstick NEGATIVE NEGATIVE   pH 6.0 5.0 - 8.0   Protein, ur NEGATIVE NEGATIVE mg/dL   Urobilinogen, UA 0.2 0.0 - 1.0 mg/dL   Nitrite NEGATIVE NEGATIVE   Leukocytes,Ua NEGATIVE NEGATIVE  POC urine pregnancy  Result Value Ref Range   Preg Test, Ur POSITIVE (A) NEGATIVE   Labs Reviewed  POC URINE PREG, ED - Abnormal; Notable for the following components:      Result Value   Preg Test, Ur POSITIVE (*)    All other components within normal limits  RESP PANEL BY RT-PCR (FLU A&B, COVID) ARPGX2  POCT URINALYSIS DIPSTICK, ED / UC     No Known Allergies  Past Medical History:  Diagnosis Date  . Asthma    Social History   Socioeconomic History  . Marital status: Married    Spouse name: Not on file  . Number of children: Not on file  . Years of education: Not on file  . Highest education level: Not on file  Occupational History  . Not on file  Tobacco Use  . Smoking status: Never Smoker  . Smokeless tobacco: Never Used  Substance and Sexual Activity  . Alcohol use: Never  . Drug use: Never  . Sexual activity: Yes    Birth control/protection: None  Other Topics Concern  . Not on file  Social History Narrative  . Not on file   Social Determinants of Health   Financial Resource Strain: Not on file  Food Insecurity: Not on file  Transportation Needs: Not on file  Physical Activity: Not on file  Stress: Not on file  Social Connections: Not on file  Intimate Partner Violence: Not on file    History reviewed. No pertinent family history. Past Surgical History:  Procedure Laterality Date  . OVARIAN CYST REMOVAL  2015   and 2013     Mardella Layman, MD 07/12/20 1053

## 2020-08-08 DIAGNOSIS — Z419 Encounter for procedure for purposes other than remedying health state, unspecified: Secondary | ICD-10-CM | POA: Diagnosis not present

## 2020-08-25 ENCOUNTER — Other Ambulatory Visit: Payer: Self-pay

## 2020-08-25 ENCOUNTER — Encounter (HOSPITAL_COMMUNITY): Payer: Self-pay | Admitting: Emergency Medicine

## 2020-08-25 ENCOUNTER — Inpatient Hospital Stay (HOSPITAL_COMMUNITY)
Admission: EM | Admit: 2020-08-25 | Discharge: 2020-08-25 | Disposition: A | Discharge status: Home / Self Care | Payer: Medicaid Other | Attending: Anesthesiology | Admitting: Anesthesiology

## 2020-08-25 DIAGNOSIS — Z5329 Procedure and treatment not carried out because of patient's decision for other reasons: Secondary | ICD-10-CM

## 2020-08-25 DIAGNOSIS — Z3A00 Weeks of gestation of pregnancy not specified: Secondary | ICD-10-CM

## 2020-08-25 DIAGNOSIS — O219 Vomiting of pregnancy, unspecified: Secondary | ICD-10-CM | POA: Diagnosis not present

## 2020-08-25 DIAGNOSIS — R109 Unspecified abdominal pain: Secondary | ICD-10-CM | POA: Diagnosis not present

## 2020-08-25 DIAGNOSIS — O99891 Other specified diseases and conditions complicating pregnancy: Secondary | ICD-10-CM

## 2020-08-25 DIAGNOSIS — Z3490 Encounter for supervision of normal pregnancy, unspecified, unspecified trimester: Secondary | ICD-10-CM

## 2020-08-25 DIAGNOSIS — O26899 Other specified pregnancy related conditions, unspecified trimester: Secondary | ICD-10-CM

## 2020-08-25 DIAGNOSIS — O26891 Other specified pregnancy related conditions, first trimester: Secondary | ICD-10-CM | POA: Diagnosis not present

## 2020-08-25 DIAGNOSIS — Z3A15 15 weeks gestation of pregnancy: Secondary | ICD-10-CM | POA: Diagnosis not present

## 2020-08-25 DIAGNOSIS — R1032 Left lower quadrant pain: Secondary | ICD-10-CM | POA: Diagnosis not present

## 2020-08-25 DIAGNOSIS — O26892 Other specified pregnancy related conditions, second trimester: Secondary | ICD-10-CM | POA: Diagnosis not present

## 2020-08-25 DIAGNOSIS — Z5321 Procedure and treatment not carried out due to patient leaving prior to being seen by health care provider: Secondary | ICD-10-CM | POA: Insufficient documentation

## 2020-08-25 LAB — URINALYSIS, ROUTINE W REFLEX MICROSCOPIC
Bilirubin Urine: NEGATIVE
Glucose, UA: NEGATIVE mg/dL
Hgb urine dipstick: NEGATIVE
Ketones, ur: NEGATIVE mg/dL
Leukocytes,Ua: NEGATIVE
Nitrite: NEGATIVE
Protein, ur: NEGATIVE mg/dL
Specific Gravity, Urine: 1.027 (ref 1.005–1.030)
pH: 6 (ref 5.0–8.0)

## 2020-08-25 NOTE — MAU Provider Note (Signed)
History     CSN: [id number]  Arrival date and time: 08/25/20 1152   Event Date/Time   First Provider Initiated Contact with Patient 08/25/20 1414      Chief Complaint  Patient presents with  . Emesis During Pregnancy  . Abdominal Pain  . Chest Pain   HPI Debra Bell is a 30 y.o. [id number] at [redacted]w[redacted]d by unsure LMP who presents for n/v, abdominal pain, and heartburn.  Reports nausea/vomiting & heartburn have been going on for over a month. Was prescribed zofran but stopped taking it because she felt like it wasn't working. Reports burning sensation in the bottom of her chest that is worse with vomiting. Hasn't treated symptoms.  This morning she felt sharp/pulling pain in her LLQ while vomiting. Pain is constant & worse with vomiting. Rates pain 7/10. Hasn't treated symptoms.  Denies fever/chills, cough, sore throat, SOB, dysuria, vaginal bleeding, or vaginal discharge.  Reports her last normal period was in September but she had a 2 day bleeding episode in November. Didn't take pregnancy tests until January.   OB History    Gravida  4   Para  2   Term  2   Preterm      AB  1   Living  2     SAB      IAB  1   Ectopic      Multiple      Live Births  2           Past Medical History:  Diagnosis Date  . Asthma     Past Surgical History:  Procedure Laterality Date  . OVARIAN CYST REMOVAL  2015   and 2013    History reviewed. No pertinent family history.  Social History   Tobacco Use  . Smoking status: Never Smoker  . Smokeless tobacco: Never Used  Vaping Use  . Vaping Use: Never used  Substance Use Topics  . Alcohol use: Never  . Drug use: Never    Allergies: No Known Allergies  Medications Prior to Admission  Medication Sig Dispense Refill Last Dose  . ondansetron (ZOFRAN-ODT) 4 MG disintegrating tablet Take 1 tablet (4 mg total) by mouth every 8 (eight) hours as needed for nausea or vomiting. 15 tablet 0 Past Week at Unknown time  .  aspirin-acetaminophen-caffeine (EXCEDRIN MIGRAINE) 250-250-65 MG tablet Take 2 tablets by mouth every 6 (six) hours as needed for headache.     . ibuprofen (ADVIL,MOTRIN) 400 MG tablet Take 1 tablet (400 mg total) by mouth every 6 (six) hours as needed. 30 tablet 0   . oxyCODONE-acetaminophen (PERCOCET/ROXICET) 5-325 MG tablet Take 1 tablet by mouth every 8 (eight) hours as needed for severe pain. 5 tablet 0   . predniSONE (DELTASONE) 50 MG tablet One daily with food 3 tablet 0     Review of Systems  Constitutional: Negative.   HENT: Negative.   Respiratory: Negative.   Cardiovascular: Positive for chest pain.  Gastrointestinal: Positive for abdominal pain, nausea and vomiting. Negative for constipation and diarrhea.  Genitourinary: Negative.    Physical Exam   Blood pressure 114/63, pulse 85, temperature 99.1 F (37.3 C), temperature source Oral, resp. rate 15, last menstrual period 05/08/2020, SpO2 100 %.  Physical Exam Vitals and nursing note reviewed.  Constitutional:      General: She is not in acute distress.    Appearance: She is well-developed.  HENT:     Head: Normocephalic and atraumatic.  Pulmonary:  Effort: Pulmonary effort is normal. No respiratory distress.     Breath sounds: Normal breath sounds. No wheezing.  Abdominal:     General: Bowel sounds are normal.     Palpations: Abdomen is soft.     Tenderness: There is no abdominal tenderness.  Skin:    General: Skin is warm and dry.  Neurological:     Mental Status: She is alert.  Psychiatric:        Mood and Affect: Mood normal.        Behavior: Behavior normal.     MAU Course  Procedures No results found for this or any previous visit (from the past 24 hour(s)).  MDM Patient should be >15 wks based on unsure LMP. RN unable to doppler FHTs. Limited abdominal exam due to body habitus.   After physical exam, patient states she needs to leave to pick up her children. Discussed with patient she would need  to sign out against medical advice since we have not yet been able to rule out emergent conditions.  Patient signed AMA form and states she will return after she picks up her children & secures child care.   Assessment and Plan   1. Pregnancy with gestation of unknown duration   2. Nausea and vomiting during pregnancy   3. Left lower quadrant abdominal pain during pregnancy   4. Left against medical advice      Debra Bell 08/25/2020, 2:14 PM

## 2020-08-25 NOTE — MAU Note (Signed)
Debra Bell is a 30 y.o. at [redacted]w[redacted]d here in MAU reporting: on Wednesday had an episode of vomiting and since then has been having intermittent cramping. 4 episodes of vomiting in the past 24 hours with some nausea with some burning in her chest.   LMP: 05/08/20 approximately  Onset of complaint: ongoing  Pain score: chest pain 10/10, abdominal pain 7/10  Vitals:   08/25/20 1210  BP: 101/83  Pulse: 85  Resp: 18  Temp: 99.2 F (37.3 C)  SpO2: 100%   Lab orders placed from triage: UA

## 2020-08-25 NOTE — MAU Note (Signed)
Patient stating that her children get off of the bus at 1500. Provider made her aware she would have to sign out AMA but she could return to be evaluated after she got her children off of the school bus. AMA form was signed.

## 2020-08-25 NOTE — ED Triage Notes (Signed)
Pt arrives to ED with chief complaint of emesis during pregnancy. LMP: 05/2020 unsure date, had a positive pregnancy test here on 07/09/20. Has been unable to get a appt for prenatal care, she is now having low abd pain with emesis.

## 2020-08-25 NOTE — ED Provider Notes (Signed)
Emergency Medicine Provider OB Triage Evaluation Note  Debra Bell is a 30 y.o. female G3 P2 unknown gestation who presents for evaluation of lower abdominal left-sided cramping with nausea and vomiting.  She reports her last menstrual cycle that was normal for her with September 5th.  She had an abnormal 2 days of bleeding in the start of November however doesn't know dates.  She has not had any prenatal care this pregnancy and no imaging.    Review of  Systems  Positive: N/V, LLQ abdominal cramping Negative: fevers, loss of fluids, bleeding  Physical Exam  BP 101/83 (BP Location: Right Arm)   Pulse 85   Temp 99.2 F (37.3 C)   Resp 18   SpO2 100%  General: Awake, no distress  HEENT: Atraumatic  Resp: Normal effort  Cardiac: Normal rate Abd: Nondistended, Mildly TTP bilateral lower abdomen, unable to palpate uterus above pelvic brim however limited by body habitus ant patient tolerance MSK: Moves all extremities without difficulty Neuro: Speech clear  Medical Decision Making  Pt evaluated for pregnancy concern and is stable for transfer to MAU. Pt is in agreement with plan for transfer.  12:38 PM Discussed with MAU APP, katherine, who accepts patient in transfer.  Clinical Impression   1. Pregnancy with gestation of unknown duration   2. Nausea and vomiting during pregnancy   3. Left lower quadrant abdominal pain during pregnancy        Norman Clay 08/25/20 1238    Long, Arlyss Repress, MD 08/26/20 [phone]

## 2020-09-05 DIAGNOSIS — Z419 Encounter for procedure for purposes other than remedying health state, unspecified: Secondary | ICD-10-CM | POA: Diagnosis not present

## 2020-09-08 ENCOUNTER — Ambulatory Visit (INDEPENDENT_AMBULATORY_CARE_PROVIDER_SITE_OTHER): Payer: Medicaid Other | Admitting: *Deleted

## 2020-09-08 DIAGNOSIS — Z3480 Encounter for supervision of other normal pregnancy, unspecified trimester: Secondary | ICD-10-CM

## 2020-09-08 DIAGNOSIS — Z3A17 17 weeks gestation of pregnancy: Secondary | ICD-10-CM

## 2020-09-08 DIAGNOSIS — Z3A00 Weeks of gestation of pregnancy not specified: Secondary | ICD-10-CM

## 2020-09-08 MED ORDER — PRENATE PIXIE 10-0.6-0.4-200 MG PO CAPS: 1.0000 | ORAL_CAPSULE | Freq: Every day | ORAL | 11 refills | Status: DC

## 2020-09-08 MED ORDER — BLOOD PRESSURE MONITOR KIT: 1.0000 | PACK | Freq: Once | 0 refills | Status: AC

## 2020-09-08 NOTE — Progress Notes (Signed)
I have reviewed this chart and agree with the RN/CMA assessment and management.    K. Meryl Tashi Band, MD, FACOG [facility] (Faculty Practice)  

## 2020-09-08 NOTE — Progress Notes (Signed)
Virtual Visit via Telephone Note  I connected with Stacie Glaze on 09/08/20 at  9:00 AM EST by telephone and verified that I am speaking with the correct person using two identifiers.  Location: Patient: home Provider: in office   I discussed the limitations, risks, security and privacy concerns of performing an evaluation and management service by telephone and the availability of in person appointments. I also discussed with the patient that there may be a patient responsible charge related to this service. The patient expressed understanding and agreed to proceed.  I provided 15 minutes of non-face-to-face time during this encounter.   PRENATAL INTAKE SUMMARY  Ms. Osorno presents today New OB Nurse Interview.  OB History    Gravida  4   Para  2   Term  2   Preterm      AB  1   Living  2     SAB      IAB  1   Ectopic      Multiple      Live Births  2          I have reviewed the patient's medical, obstetrical, social, and family histories, medications, and available lab results.  SUBJECTIVE She has no unusual complaints  OBJECTIVE Initial Physical Exam (New OB)  GENERAL APPEARANCE: Sounds well via telephone visit   ASSESSMENT Normal pregnancy  PLAN Prenatal care [facility] Prenate Pixie, BP cuff and u/s ordered  Meds ordered this encounter  Medications  . Prenat-FeAsp-Meth-FA-DHA w/o A (PRENATE PIXIE) 10-0.6-0.4-200 MG CAPS    Sig: Take 1 capsule by mouth daily.    Dispense:  30 capsule    Refill:  11  . Blood Pressure Monitor KIT    Sig: 1 kit by Does not apply route once for 1 dose.    Dispense:  1 kit    Refill:  0   Orders Placed This Encounter  Procedures  . [place] MFM OB DETAIL +14 WK

## 2020-09-12 ENCOUNTER — Encounter: Payer: Self-pay | Admitting: Obstetrics

## 2020-09-12 ENCOUNTER — Ambulatory Visit (INDEPENDENT_AMBULATORY_CARE_PROVIDER_SITE_OTHER): Payer: Medicaid Other | Admitting: Obstetrics

## 2020-09-12 ENCOUNTER — Ambulatory Visit (INDEPENDENT_AMBULATORY_CARE_PROVIDER_SITE_OTHER): Payer: Medicaid Other

## 2020-09-12 ENCOUNTER — Other Ambulatory Visit (HOSPITAL_COMMUNITY)
Admission: RE | Admit: 2020-09-12 | Discharge: 2020-09-12 | Disposition: A | Discharge status: Home / Self Care | Payer: Medicaid Other | Source: Ambulatory Visit | Attending: Obstetrics | Admitting: Obstetrics

## 2020-09-12 ENCOUNTER — Other Ambulatory Visit: Payer: Self-pay

## 2020-09-12 VITALS — BP 105/71 | HR 89 | Wt 259.6 lb

## 2020-09-12 DIAGNOSIS — Z3480 Encounter for supervision of other normal pregnancy, unspecified trimester: Secondary | ICD-10-CM | POA: Insufficient documentation

## 2020-09-12 DIAGNOSIS — Z3687 Encounter for antenatal screening for uncertain dates: Secondary | ICD-10-CM

## 2020-09-12 DIAGNOSIS — O368390 Maternal care for abnormalities of the fetal heart rate or rhythm, unspecified trimester, not applicable or unspecified: Secondary | ICD-10-CM

## 2020-09-12 DIAGNOSIS — N898 Other specified noninflammatory disorders of vagina: Secondary | ICD-10-CM | POA: Diagnosis not present

## 2020-09-12 MED ORDER — BLOOD PRESSURE KIT DEVI: 1.0000 | 0 refills | Status: DC

## 2020-09-12 NOTE — Progress Notes (Signed)
Subjective:    Debra Bell is being seen today for her first obstetrical visit.  This is not a planned pregnancy. She is at 87w1dgestation. Her obstetrical history is significant for obesity. Relationship with FOB: significant other, living together. Patient does not intend to breast feed. Pregnancy history fully reviewed.  The information documented in the HPI was reviewed and verified.  Menstrual History: OB History    Gravida  4   Para  2   Term  2   Preterm      AB  1   Living  2     SAB      IAB  1   Ectopic      Multiple      Live Births  2            Patient's last menstrual period was 05/08/2020 (approximate).    Past Medical History:  Diagnosis Date  . Asthma   . History of ovarian cyst     Past Surgical History:  Procedure Laterality Date  . CESAREAN SECTION    . OVARIAN CYST REMOVAL  2015   and 2013    (Not in a hospital admission)  No Known Allergies  Social History   Tobacco Use  . Smoking status: Never Smoker  . Smokeless tobacco: Never Used  Substance Use Topics  . Alcohol use: Never    History reviewed. No pertinent family history.   Review of Systems Constitutional: negative for weight loss Gastrointestinal: negative for vomiting Genitourinary:positive for vaginal discharge and irritation.  negative for genital lesions and dysuria Musculoskeletal:negative for back pain Behavioral/Psych: negative for abusive relationship, depression, illegal drug usage and tobacco use    Objective:    BP 105/71   Pulse 89   Wt 259 lb 9.6 oz (117.8 kg)   LMP 05/08/2020 (Approximate)   BMI 43.20 kg/m  General Appearance:    Alert, cooperative, no distress, appears stated age  Head:    Normocephalic, without obvious abnormality, atraumatic  Eyes:    PERRL, conjunctiva/corneas clear, EOM's intact, fundi    benign, both eyes  Ears:    Normal TM's and external ear canals, both ears  Nose:   Nares normal, septum midline, mucosa normal, no  drainage    or sinus tenderness  Throat:   Lips, mucosa, and tongue normal; teeth and gums normal  Neck:   Supple, symmetrical, trachea midline, no adenopathy;    thyroid:  no enlargement/tenderness/nodules; no carotid   bruit or JVD  Back:     Symmetric, no curvature, ROM normal, no CVA tenderness  Lungs:     Clear to auscultation bilaterally, respirations unlabored  Chest Wall:    No tenderness or deformity   Heart:    Regular rate and rhythm, S1 and S2 normal, no murmur, rub   or gallop  Breast Exam:    No tenderness, masses, or nipple abnormality  Abdomen:     Soft, non-tender, bowel sounds active all four quadrants,    no masses, no organomegaly  Genitalia:    Normal female without lesion, discharge or tenderness  Extremities:   Extremities normal, atraumatic, no cyanosis or edema  Pulses:   2+ and symmetric all extremities  Skin:   Skin color, texture, turgor normal, no rashes or lesions  Lymph nodes:   Cervical, supraclavicular, and axillary nodes normal  Neurologic:   CNII-XII intact, normal strength, sensation and reflexes    throughout      Lab Review Urine pregnancy test  Labs reviewed yes Radiologic studies reviewed yes  Assessment:    Pregnancy at 23w1dweeks    Plan:     1. Supervision of other normal pregnancy, antepartum Rx: - Cytology - PAP( Scissors) - Cervicovaginal ancillary only( Spry) - Genetic Screening - CBC/D/Plt+RPR+Rh+ABO+Rub Ab... - Culture, OB Urine - [place]OB Comp + 14 Wk; Future - Enroll Patient in Babyscripts - Blood Pressure Monitoring (BLOOD PRESSURE KIT) DEVI; 1 kit by Does not apply route once a week.  Dispense: 1 each; Refill: 0  2. Vaginal discharge  3. Vaginal irritation  4. Unsure of LMP (last menstrual period) as reason for ultrasound scan Rx: - [place]OB Limited; Future  5. Unable to hear fetal heart tones as reason for ultrasound scan Rx: - [place]OB Limited; Future  Prenatal vitamins.  Counseling provided regarding  continued use of seat belts, cessation of alcohol consumption, smoking or use of illicit drugs; infection precautions i.e., influenza/TDAP immunizations, toxoplasmosis,CMV, parvovirus, listeria and varicella; workplace safety, exercise during pregnancy; routine dental care, safe medications, sexual activity, hot tubs, saunas, pools, travel, caffeine use, fish and methlymercury, potential toxins, hair treatments, varicose veins Weight gain recommendations per IOM guidelines reviewed: underweight/BMI< 18.5--> gain 28 - 40 lbs; normal weight/BMI 18.5 - 24.9--> gain 25 - 35 lbs; overweight/BMI 25 - 29.9--> gain 15 - 25 lbs; obese/BMI >30->gain  11 - 20 lbs Problem list reviewed and updated. FIRST/CF mutation testing/NIPT/QUAD SCREEN/fragile X/Ashkenazi Jewish population testing/Spinal muscular atrophy discussed: requested. Role of ultrasound in pregnancy discussed; fetal survey: requested. Amniocentesis discussed: not indicated.  Meds ordered this encounter  Medications  . Blood Pressure Monitoring (BLOOD PRESSURE KIT) DEVI    Sig: 1 kit by Does not apply route once a week.    Dispense:  1 each    Refill:  0   Orders Placed This Encounter  Procedures  . Culture, OB Urine  . UK[place]OB Comp + 14 Wk    Standing Status:   Future    Standing Expiration Date:   09/12/2021    Order Specific Question:   Reason for Exam (SYMPTOM  OR DIAGNOSIS REQUIRED)    Answer:   Anatomy    Order Specific Question:   Preferred Imaging Location?    Answer:   [facility]  . Genetic Screening  . CBC/D/Plt+RPR+Rh+ABO+Rub Ab...    Follow up in 4 weeks. 50% of 20 min visit spent on counseling and coordination of care.    HShelly Bombard MD 09/12/2020 1:36 PM

## 2020-09-12 NOTE — Progress Notes (Signed)
Patient presents for Initial Prenatal visit. Patient complains of having vaginal discharge and irritation. Np other concerns.

## 2020-09-13 ENCOUNTER — Other Ambulatory Visit: Payer: Self-pay | Admitting: Obstetrics

## 2020-09-13 DIAGNOSIS — B3731 Acute candidiasis of vulva and vagina: Secondary | ICD-10-CM

## 2020-09-13 DIAGNOSIS — B373 Candidiasis of vulva and vagina: Secondary | ICD-10-CM

## 2020-09-13 LAB — CERVICOVAGINAL ANCILLARY ONLY
Bacterial Vaginitis (gardnerella): NEGATIVE
Candida Glabrata: NEGATIVE
Candida Vaginitis: POSITIVE — AB
Chlamydia: NEGATIVE
Comment: NEGATIVE
Comment: NEGATIVE
Comment: NEGATIVE
Comment: NEGATIVE
Comment: NEGATIVE
Comment: NORMAL
Neisseria Gonorrhea: NEGATIVE
Trichomonas: NEGATIVE

## 2020-09-13 LAB — CBC/D/PLT+RPR+RH+ABO+RUB AB...
Antibody Screen: NEGATIVE
Basophils Absolute: 0 10*3/uL (ref 0.0–0.2)
Basos: 0 %
EOS (ABSOLUTE): 0 10*3/uL (ref 0.0–0.4)
Eos: 0 %
HCV Ab: <0.1 s/co ratio (ref 0.0–0.9)
HIV Screen 4th Generation wRfx: NONREACTIVE
Hematocrit: 36.3 % (ref 34.0–46.6)
Hemoglobin: 11.5 g/dL (ref 11.1–15.9)
Hepatitis B Surface Ag: NEGATIVE
Immature Grans (Abs): 0.1 10*3/uL (ref 0.0–0.1)
Immature Granulocytes: 1 %
Lymphocytes Absolute: 2.3 10*3/uL (ref 0.7–3.1)
Lymphs: 24 %
MCH: 26.3 pg — ABNORMAL LOW (ref 26.6–33.0)
MCHC: 31.7 g/dL (ref 31.5–35.7)
MCV: 83 fL (ref 79–97)
Monocytes Absolute: 0.7 10*3/uL (ref 0.1–0.9)
Monocytes: 8 %
Neutrophils Absolute: 6.3 10*3/uL (ref 1.4–7.0)
Neutrophils: 67 %
Platelets: 293 10*3/uL (ref 150–450)
RBC: 4.38 x10E6/uL (ref 3.77–5.28)
RDW: 14.5 % (ref 11.7–15.4)
RPR Ser Ql: NONREACTIVE
Rh Factor: POSITIVE
Rubella Antibodies, IGG: 1.51 index (ref >0.99)
WBC: 9.3 10*3/uL (ref 3.4–10.8)

## 2020-09-13 LAB — CYTOLOGY - PAP
Comment: NEGATIVE
Diagnosis: NEGATIVE
High risk HPV: NEGATIVE

## 2020-09-13 LAB — HCV INTERPRETATION

## 2020-09-13 MED ORDER — TERCONAZOLE 0.4 % VA CREA: 1.0000 | TOPICAL_CREAM | Freq: Every day | VAGINAL | 0 refills | Status: DC

## 2020-09-14 LAB — CULTURE, OB URINE

## 2020-09-14 LAB — URINE CULTURE, OB REFLEX

## 2020-09-18 ENCOUNTER — Encounter: Payer: Self-pay | Admitting: Obstetrics

## 2020-09-21 ENCOUNTER — Encounter: Payer: Self-pay | Admitting: Family Medicine

## 2020-09-21 DIAGNOSIS — O34219 Maternal care for unspecified type scar from previous cesarean delivery: Secondary | ICD-10-CM | POA: Insufficient documentation

## 2020-09-22 ENCOUNTER — Encounter: Payer: Self-pay | Admitting: *Deleted

## 2020-09-22 ENCOUNTER — Ambulatory Visit: Payer: Medicaid Other | Admitting: *Deleted

## 2020-09-22 ENCOUNTER — Ambulatory Visit: Payer: Medicaid Other | Attending: Obstetrics and Gynecology

## 2020-09-22 ENCOUNTER — Other Ambulatory Visit: Payer: Self-pay | Admitting: *Deleted

## 2020-09-22 ENCOUNTER — Other Ambulatory Visit: Payer: Self-pay

## 2020-09-22 DIAGNOSIS — O0932 Supervision of pregnancy with insufficient antenatal care, second trimester: Secondary | ICD-10-CM | POA: Insufficient documentation

## 2020-09-22 DIAGNOSIS — Z3A17 17 weeks gestation of pregnancy: Secondary | ICD-10-CM | POA: Diagnosis not present

## 2020-09-22 DIAGNOSIS — Z3482 Encounter for supervision of other normal pregnancy, second trimester: Secondary | ICD-10-CM | POA: Diagnosis present

## 2020-09-22 DIAGNOSIS — Z3480 Encounter for supervision of other normal pregnancy, unspecified trimester: Secondary | ICD-10-CM | POA: Diagnosis not present

## 2020-09-22 DIAGNOSIS — O99212 Obesity complicating pregnancy, second trimester: Secondary | ICD-10-CM | POA: Insufficient documentation

## 2020-09-22 DIAGNOSIS — O34219 Maternal care for unspecified type scar from previous cesarean delivery: Secondary | ICD-10-CM

## 2020-09-22 DIAGNOSIS — Z363 Encounter for antenatal screening for malformations: Secondary | ICD-10-CM | POA: Insufficient documentation

## 2020-09-22 DIAGNOSIS — Z362 Encounter for other antenatal screening follow-up: Secondary | ICD-10-CM

## 2020-09-22 DIAGNOSIS — Z3A18 18 weeks gestation of pregnancy: Secondary | ICD-10-CM | POA: Insufficient documentation

## 2020-09-25 ENCOUNTER — Encounter: Payer: Self-pay | Admitting: Obstetrics

## 2020-10-06 DIAGNOSIS — Z419 Encounter for procedure for purposes other than remedying health state, unspecified: Secondary | ICD-10-CM | POA: Diagnosis not present

## 2020-10-10 ENCOUNTER — Ambulatory Visit (INDEPENDENT_AMBULATORY_CARE_PROVIDER_SITE_OTHER): Payer: Medicaid Other | Admitting: Obstetrics and Gynecology

## 2020-10-10 ENCOUNTER — Other Ambulatory Visit: Payer: Self-pay

## 2020-10-10 ENCOUNTER — Encounter: Payer: Self-pay | Admitting: Obstetrics and Gynecology

## 2020-10-10 VITALS — BP 109/73 | HR 86 | Wt 263.0 lb

## 2020-10-10 DIAGNOSIS — Z3480 Encounter for supervision of other normal pregnancy, unspecified trimester: Secondary | ICD-10-CM | POA: Diagnosis not present

## 2020-10-10 DIAGNOSIS — Z3009 Encounter for other general counseling and advice on contraception: Secondary | ICD-10-CM

## 2020-10-10 DIAGNOSIS — O34219 Maternal care for unspecified type scar from previous cesarean delivery: Secondary | ICD-10-CM

## 2020-10-10 NOTE — Progress Notes (Signed)
ROB c/o varicose veins that hurts on left side when she lays on them.

## 2020-10-10 NOTE — Patient Instructions (Signed)

## 2020-10-10 NOTE — Progress Notes (Signed)
Subjective:  Rosalyn Archambault is a 30 y.o. [id number] at [redacted]w[redacted]d being seen today for ongoing prenatal care.  She is currently monitored for the following issues for this low-risk pregnancy and has Supervision of other normal pregnancy, antepartum and Previous cesarean delivery affecting pregnancy, antepartum on their problem list.  Patient reports no complaints.  Contractions: Not present. Vag. Bleeding: None.  Movement: Present. Denies leaking of fluid.   The following portions of the patient's history were reviewed and updated as appropriate: allergies, current medications, past family history, past medical history, past social history, past surgical history and problem list. Problem list updated.  Objective:   Vitals:   10/10/20 1414  BP: 109/73  Pulse: 86  Weight: 263 lb (119.3 kg)    Fetal Status: Fetal Heart Rate (bpm): 150   Movement: Present     General:  Alert, oriented and cooperative. Patient is in no acute distress.  Skin: Skin is warm and dry. No rash noted.   Cardiovascular: Normal heart rate noted  Respiratory: Normal respiratory effort, no problems with respiration noted  Abdomen: Soft, gravid, appropriate for gestational age. Pain/Pressure: Present     Pelvic:  Cervical exam deferred        Extremities: Normal range of motion.  Edema: None  Mental Status: Normal mood and affect. Normal behavior. Normal judgment and thought content.   Urinalysis:      Assessment and Plan:  Pregnancy: [id number] at [redacted]w[redacted]d  1. Supervision of other normal pregnancy, antepartum Stable F/U anatomy scan this month  2. Previous cesarean delivery affecting pregnancy, antepartum Repeat at 39 weeks, H/O 2 c sections  3. Unwanted fertilty BTL papers at 28 week visit  Preterm labor symptoms and general obstetric precautions including but not limited to vaginal bleeding, contractions, leaking of fluid and fetal movement were reviewed in detail with the patient. Please refer to After Visit Summary  for other counseling recommendations.  Return in about 4 weeks (around 11/07/2020) for virtual, OB visit, any provider.   Hermina Staggers, MD

## 2020-10-12 LAB — AFP, SERUM, OPEN SPINA BIFIDA
AFP MoM: 1.46
AFP Value: 75 ng/mL
Gest. Age on Collection Date: 20.6 weeks
Maternal Age At EDD: 31.5 yr
OSBR Risk 1 IN: 6095
Test Results:: NEGATIVE
Weight: 263 [lb_av]

## 2020-10-16 DIAGNOSIS — Z3480 Encounter for supervision of other normal pregnancy, unspecified trimester: Secondary | ICD-10-CM | POA: Diagnosis not present

## 2020-10-24 ENCOUNTER — Ambulatory Visit: Payer: Medicaid Other

## 2020-10-27 ENCOUNTER — Encounter: Payer: Self-pay | Admitting: *Deleted

## 2020-10-27 ENCOUNTER — Other Ambulatory Visit: Payer: Self-pay

## 2020-10-27 ENCOUNTER — Ambulatory Visit: Payer: Medicaid Other | Admitting: *Deleted

## 2020-10-27 ENCOUNTER — Ambulatory Visit: Payer: Medicaid Other | Attending: Obstetrics and Gynecology

## 2020-10-27 DIAGNOSIS — O99212 Obesity complicating pregnancy, second trimester: Secondary | ICD-10-CM | POA: Diagnosis not present

## 2020-10-27 DIAGNOSIS — Z3480 Encounter for supervision of other normal pregnancy, unspecified trimester: Secondary | ICD-10-CM | POA: Diagnosis not present

## 2020-10-27 DIAGNOSIS — O34219 Maternal care for unspecified type scar from previous cesarean delivery: Secondary | ICD-10-CM | POA: Diagnosis not present

## 2020-10-27 DIAGNOSIS — Z362 Encounter for other antenatal screening follow-up: Secondary | ICD-10-CM | POA: Insufficient documentation

## 2020-10-27 DIAGNOSIS — Z3A23 23 weeks gestation of pregnancy: Secondary | ICD-10-CM | POA: Diagnosis not present

## 2020-10-30 ENCOUNTER — Other Ambulatory Visit: Payer: Self-pay | Admitting: *Deleted

## 2020-10-30 DIAGNOSIS — R638 Other symptoms and signs concerning food and fluid intake: Secondary | ICD-10-CM

## 2020-11-05 DIAGNOSIS — Z419 Encounter for procedure for purposes other than remedying health state, unspecified: Secondary | ICD-10-CM | POA: Diagnosis not present

## 2020-11-07 ENCOUNTER — Encounter: Payer: Medicaid Other | Admitting: Obstetrics and Gynecology

## 2020-11-22 ENCOUNTER — Encounter: Payer: Self-pay | Admitting: Obstetrics

## 2020-11-22 ENCOUNTER — Other Ambulatory Visit: Payer: Self-pay

## 2020-11-22 ENCOUNTER — Ambulatory Visit (INDEPENDENT_AMBULATORY_CARE_PROVIDER_SITE_OTHER): Payer: Medicaid Other | Admitting: Obstetrics

## 2020-11-22 VITALS — BP 94/66 | HR 101 | Wt 276.0 lb

## 2020-11-22 DIAGNOSIS — Z3480 Encounter for supervision of other normal pregnancy, unspecified trimester: Secondary | ICD-10-CM

## 2020-11-22 DIAGNOSIS — M549 Dorsalgia, unspecified: Secondary | ICD-10-CM

## 2020-11-22 NOTE — Progress Notes (Signed)
Subjective:  Debra Bell is a 30 y.o. [id number] at [redacted]w[redacted]d being seen today for ongoing prenatal care.  She is currently monitored for the following issues for this low-risk pregnancy and has Supervision of other normal pregnancy, antepartum; Previous cesarean delivery affecting pregnancy, antepartum; and Unwanted fertility on their problem list.  Patient reports backache and upper abdominal pain.  Contractions: Not present. Vag. Bleeding: None.  Movement: Present. Denies leaking of fluid.   The following portions of the patient's history were reviewed and updated as appropriate: allergies, current medications, past family history, past medical history, past social history, past surgical history and problem list. Problem list updated.  Objective:   Vitals:   11/22/20 1513  BP: 94/66  Pulse: (!) 101  Weight: 276 lb (125.2 kg)    Fetal Status:     Movement: Present     General:  Alert, oriented and cooperative. Patient is in no acute distress.  Skin: Skin is warm and dry. No rash noted.   Cardiovascular: Normal heart rate noted  Respiratory: Normal respiratory effort, no problems with respiration noted  Abdomen: Soft, gravid, appropriate for gestational age. Pain/Pressure: Present     Pelvic:  Cervical exam deferred        Extremities: Normal range of motion.  Edema: Trace  Mental Status: Normal mood and affect. Normal behavior. Normal judgment and thought content.   Urinalysis:      Assessment and Plan:  Pregnancy: [id number] at 102w0d  1. Supervision of other normal pregnancy, antepartum  2. Backache and upper abdominal pain symptom - Maternity Belt recommended for support  Preterm labor symptoms and general obstetric precautions including but not limited to vaginal bleeding, contractions, leaking of fluid and fetal movement were reviewed in detail with the patient. Please refer to After Visit Summary for other counseling recommendations.   Return in about 2 weeks (around 12/06/2020)  for ROB, 2 hour OGTT.   Brock Bad, MD  11/22/20

## 2020-11-22 NOTE — Progress Notes (Signed)
ROB 27w  CC: abdominal pain pt concerned about baby's position.

## 2020-12-06 DIAGNOSIS — Z419 Encounter for procedure for purposes other than remedying health state, unspecified: Secondary | ICD-10-CM | POA: Diagnosis not present

## 2020-12-07 ENCOUNTER — Encounter: Payer: Medicaid Other | Admitting: Obstetrics & Gynecology

## 2020-12-07 ENCOUNTER — Other Ambulatory Visit: Payer: Medicaid Other

## 2020-12-08 ENCOUNTER — Other Ambulatory Visit: Payer: Self-pay

## 2020-12-08 ENCOUNTER — Ambulatory Visit: Payer: Medicaid Other | Attending: Obstetrics

## 2020-12-08 ENCOUNTER — Other Ambulatory Visit: Payer: Self-pay | Admitting: Obstetrics

## 2020-12-08 ENCOUNTER — Ambulatory Visit: Payer: Medicaid Other | Admitting: *Deleted

## 2020-12-08 DIAGNOSIS — Z3A29 29 weeks gestation of pregnancy: Secondary | ICD-10-CM | POA: Insufficient documentation

## 2020-12-08 DIAGNOSIS — Z148 Genetic carrier of other disease: Secondary | ICD-10-CM | POA: Insufficient documentation

## 2020-12-08 DIAGNOSIS — O99213 Obesity complicating pregnancy, third trimester: Secondary | ICD-10-CM | POA: Diagnosis not present

## 2020-12-08 DIAGNOSIS — R638 Other symptoms and signs concerning food and fluid intake: Secondary | ICD-10-CM | POA: Insufficient documentation

## 2020-12-08 DIAGNOSIS — O34219 Maternal care for unspecified type scar from previous cesarean delivery: Secondary | ICD-10-CM | POA: Insufficient documentation

## 2020-12-08 DIAGNOSIS — Z3480 Encounter for supervision of other normal pregnancy, unspecified trimester: Secondary | ICD-10-CM | POA: Diagnosis not present

## 2020-12-08 DIAGNOSIS — Z362 Encounter for other antenatal screening follow-up: Secondary | ICD-10-CM | POA: Insufficient documentation

## 2020-12-11 ENCOUNTER — Other Ambulatory Visit: Payer: Self-pay | Admitting: *Deleted

## 2020-12-11 DIAGNOSIS — Z6841 Body Mass Index (BMI) 40.0 and over, adult: Secondary | ICD-10-CM

## 2020-12-27 ENCOUNTER — Encounter: Payer: Self-pay | Admitting: Obstetrics and Gynecology

## 2020-12-27 ENCOUNTER — Other Ambulatory Visit: Payer: Self-pay

## 2020-12-27 ENCOUNTER — Encounter: Payer: Self-pay | Admitting: *Deleted

## 2020-12-27 ENCOUNTER — Other Ambulatory Visit: Payer: Self-pay | Admitting: Obstetrics and Gynecology

## 2020-12-27 ENCOUNTER — Telehealth: Payer: Self-pay | Admitting: *Deleted

## 2020-12-27 ENCOUNTER — Ambulatory Visit (INDEPENDENT_AMBULATORY_CARE_PROVIDER_SITE_OTHER): Payer: Medicaid Other | Admitting: Obstetrics and Gynecology

## 2020-12-27 ENCOUNTER — Other Ambulatory Visit: Payer: Medicaid Other

## 2020-12-27 VITALS — BP 104/71 | HR 91 | Wt 284.0 lb

## 2020-12-27 DIAGNOSIS — Z3480 Encounter for supervision of other normal pregnancy, unspecified trimester: Secondary | ICD-10-CM

## 2020-12-27 DIAGNOSIS — Z3A32 32 weeks gestation of pregnancy: Secondary | ICD-10-CM

## 2020-12-27 DIAGNOSIS — O26893 Other specified pregnancy related conditions, third trimester: Secondary | ICD-10-CM

## 2020-12-27 DIAGNOSIS — O34219 Maternal care for unspecified type scar from previous cesarean delivery: Secondary | ICD-10-CM

## 2020-12-27 DIAGNOSIS — K219 Gastro-esophageal reflux disease without esophagitis: Secondary | ICD-10-CM

## 2020-12-27 DIAGNOSIS — Z3009 Encounter for other general counseling and advice on contraception: Secondary | ICD-10-CM

## 2020-12-27 DIAGNOSIS — Z23 Encounter for immunization: Secondary | ICD-10-CM

## 2020-12-27 NOTE — Addendum Note (Signed)
Addended by: Maretta Bees on: 12/27/2020 09:28 AM   Modules accepted: Orders

## 2020-12-27 NOTE — Progress Notes (Signed)
   PRENATAL VISIT NOTE  Subjective:  Debra Bell is a 30 y.o. [id number] at [redacted]w[redacted]d being seen today for ongoing prenatal care.  She is currently monitored for the following issues for this high-risk pregnancy and has Supervision of other normal pregnancy, antepartum; Previous cesarean delivery affecting pregnancy, antepartum; and Unwanted fertility on their problem list.  Patient reports heartburn.  Contractions: Not present. Vag. Bleeding: None.  Movement: Present. Denies leaking of fluid.   The following portions of the patient's history were reviewed and updated as appropriate: allergies, current medications, past family history, past medical history, past social history, past surgical history and problem list.   Objective:   Vitals:   12/27/20 0838  BP: 104/71  Pulse: 91  Weight: 284 lb (128.8 kg)    Fetal Status:     Movement: Present     General:  Alert, oriented and cooperative. Patient is in no acute distress.  Skin: Skin is warm and dry. No rash noted.   Cardiovascular: Normal heart rate noted  Respiratory: Normal respiratory effort, no problems with respiration noted  Abdomen: Soft, gravid, appropriate for gestational age.  Pain/Pressure: Absent     Pelvic: Cervical exam deferred        Extremities: Normal range of motion.  Edema: Trace  Mental Status: Normal mood and affect. Normal behavior. Normal judgment and thought content.   Assessment and Plan:  Pregnancy: [id number] at [redacted]w[redacted]d  1. Supervision of other normal pregnancy, antepartum Tdap today - Glucose Tolerance, 2 Hours w/1 Hour - RPR - CBC - HIV antibody (with reflex) - Ambulatory referral to Integrated Behavioral Health - encouraged patient to consider COVID vaccination  2. Previous cesarean delivery affecting pregnancy, antepartum RCS scheduled for 39 weeks today  3. Unwanted fertility Btl papers today, still unsure  4. [redacted] weeks gestation of pregnancy  5. Gastroesophageal reflux disease, unspecified  whether esophagitis present - Take OTC meds - elevated head of bed - stay upright after meals   Preterm labor symptoms and general obstetric precautions including but not limited to vaginal bleeding, contractions, leaking of fluid and fetal movement were reviewed in detail with the patient. Please refer to After Visit Summary for other counseling recommendations.   Return in about 2 weeks (around 01/10/2021) for high OB, in person.  Future Appointments  Date Time Provider Department Center  12/27/2020 10:15 AM Conan Bowens, [place] [facility] None  01/05/2021 11:00 AM [facility] NURSE [facility]  01/05/2021 11:15 AM [facility] US2 [facility]  01/19/2021 10:45 AM [facility] N[facility]SE [facility]  01/19/2021 11:00 AM [facility] US1 [facility]    Conan Bowens, MD

## 2020-12-27 NOTE — Telephone Encounter (Signed)
All to patient to provide date for scheduled cesarean Section. No answer and voice mail is full.

## 2020-12-27 NOTE — Progress Notes (Addendum)
HOB, c/o SOB, numbness in legs, heartburns x 1 month, it is keeping her awake at nights. TDAP given in LD, tolerated well.

## 2020-12-28 LAB — CBC
Hematocrit: 28.4 % — ABNORMAL LOW (ref 34.0–46.6)
Hemoglobin: 8.7 g/dL — ABNORMAL LOW (ref 11.1–15.9)
MCH: 24.4 pg — ABNORMAL LOW (ref 26.6–33.0)
MCHC: 30.6 g/dL — ABNORMAL LOW (ref 31.5–35.7)
MCV: 80 fL (ref 79–97)
Platelets: 246 10*3/uL (ref 150–450)
RBC: 3.57 x10E6/uL — ABNORMAL LOW (ref 3.77–5.28)
RDW: 12.8 % (ref 11.7–15.4)
WBC: 7.6 10*3/uL (ref 3.4–10.8)

## 2020-12-28 LAB — GLUCOSE TOLERANCE, 2 HOURS W/ 1HR
Glucose, 1 hour: 105 mg/dL (ref 65–179)
Glucose, 2 hour: 70 mg/dL (ref 65–152)
Glucose, Fasting: 83 mg/dL (ref 65–91)

## 2020-12-28 LAB — RPR: RPR Ser Ql: NONREACTIVE

## 2020-12-28 LAB — HIV ANTIBODY (ROUTINE TESTING W REFLEX): HIV Screen 4th Generation wRfx: NONREACTIVE

## 2020-12-29 ENCOUNTER — Encounter: Payer: Self-pay | Admitting: Family Medicine

## 2020-12-29 ENCOUNTER — Other Ambulatory Visit: Payer: Self-pay | Admitting: Family Medicine

## 2020-12-29 DIAGNOSIS — D509 Iron deficiency anemia, unspecified: Secondary | ICD-10-CM | POA: Insufficient documentation

## 2021-01-02 NOTE — Telephone Encounter (Signed)
Letter mailed to patient and visible on My Chart. Encounter closed.

## 2021-01-05 ENCOUNTER — Other Ambulatory Visit: Payer: Self-pay | Admitting: *Deleted

## 2021-01-05 ENCOUNTER — Ambulatory Visit: Payer: Medicaid Other | Attending: Obstetrics

## 2021-01-05 ENCOUNTER — Ambulatory Visit: Payer: Medicaid Other | Admitting: *Deleted

## 2021-01-05 ENCOUNTER — Encounter: Payer: Self-pay | Admitting: *Deleted

## 2021-01-05 ENCOUNTER — Other Ambulatory Visit: Payer: Self-pay

## 2021-01-05 VITALS — BP 98/56 | HR 91

## 2021-01-05 DIAGNOSIS — O34219 Maternal care for unspecified type scar from previous cesarean delivery: Secondary | ICD-10-CM

## 2021-01-05 DIAGNOSIS — Z148 Genetic carrier of other disease: Secondary | ICD-10-CM

## 2021-01-05 DIAGNOSIS — Z362 Encounter for other antenatal screening follow-up: Secondary | ICD-10-CM | POA: Diagnosis not present

## 2021-01-05 DIAGNOSIS — Z3A33 33 weeks gestation of pregnancy: Secondary | ICD-10-CM | POA: Insufficient documentation

## 2021-01-05 DIAGNOSIS — O99213 Obesity complicating pregnancy, third trimester: Secondary | ICD-10-CM | POA: Insufficient documentation

## 2021-01-05 DIAGNOSIS — Z98891 History of uterine scar from previous surgery: Secondary | ICD-10-CM

## 2021-01-05 DIAGNOSIS — Z3480 Encounter for supervision of other normal pregnancy, unspecified trimester: Secondary | ICD-10-CM

## 2021-01-05 DIAGNOSIS — Z6841 Body Mass Index (BMI) 40.0 and over, adult: Secondary | ICD-10-CM

## 2021-01-05 DIAGNOSIS — Z419 Encounter for procedure for purposes other than remedying health state, unspecified: Secondary | ICD-10-CM | POA: Diagnosis not present

## 2021-01-10 ENCOUNTER — Other Ambulatory Visit: Payer: Self-pay

## 2021-01-10 ENCOUNTER — Encounter (HOSPITAL_COMMUNITY)
Admission: RE | Admit: 2021-01-10 | Discharge: 2021-01-10 | Disposition: A | Discharge status: Home / Self Care | Payer: Medicaid Other | Source: Ambulatory Visit | Attending: Family Medicine | Admitting: Family Medicine

## 2021-01-10 DIAGNOSIS — O99013 Anemia complicating pregnancy, third trimester: Secondary | ICD-10-CM | POA: Diagnosis not present

## 2021-01-10 MED ORDER — SODIUM CHLORIDE 0.9 % IV SOLN
500.0000 mg | Freq: Once | INTRAVENOUS | Status: AC
  Administered 2021-01-10: 500 mg via INTRAVENOUS
  Filled 2021-01-10: qty 25

## 2021-01-11 ENCOUNTER — Other Ambulatory Visit: Payer: Self-pay | Admitting: Obstetrics & Gynecology

## 2021-01-11 ENCOUNTER — Encounter: Payer: Self-pay | Admitting: Obstetrics & Gynecology

## 2021-01-11 ENCOUNTER — Ambulatory Visit (INDEPENDENT_AMBULATORY_CARE_PROVIDER_SITE_OTHER): Payer: Medicaid Other | Admitting: Obstetrics & Gynecology

## 2021-01-11 VITALS — BP 102/70 | HR 94 | Wt 282.0 lb

## 2021-01-11 DIAGNOSIS — Z3480 Encounter for supervision of other normal pregnancy, unspecified trimester: Secondary | ICD-10-CM

## 2021-01-11 DIAGNOSIS — O34219 Maternal care for unspecified type scar from previous cesarean delivery: Secondary | ICD-10-CM

## 2021-01-11 MED ORDER — SODIUM CHLORIDE 0.9 % IV SOLN: 500.0000 mg | Freq: Once | INTRAVENOUS | Status: DC

## 2021-01-11 NOTE — Progress Notes (Signed)
Meds ordered this encounter  Medications   iron sucrose (VENOFER) 500 mg in sodium chloride 0.9 % 250 mL IVPB

## 2021-01-11 NOTE — Progress Notes (Signed)
   PRENATAL VISIT NOTE  Subjective:  Debra Bell is a 30 y.o. [id number] at [redacted]w[redacted]d being seen today for ongoing prenatal care.  She is currently monitored for the following issues for this high-risk pregnancy and has Supervision of other normal pregnancy, antepartum; Previous cesarean delivery affecting pregnancy, antepartum; Unwanted fertility; and Iron deficiency anemia on their problem list.  Patient reports heartburn and nausea.  Contractions: Not present. Vag. Bleeding: None.  Movement: Present. Denies leaking of fluid.   The following portions of the patient's history were reviewed and updated as appropriate: allergies, current medications, past family history, past medical history, past social history, past surgical history and problem list.   Objective:   Vitals:   01/11/21 0817  BP: 102/70  Pulse: 94  Weight: 282 lb (127.9 kg)    Fetal Status: Fetal Heart Rate (bpm): 140   Movement: Present     General:  Alert, oriented and cooperative. Patient is in no acute distress.  Skin: Skin is warm and dry. No rash noted.   Cardiovascular: Normal heart rate noted  Respiratory: Normal respiratory effort, no problems with respiration noted  Abdomen: Soft, gravid, appropriate for gestational age.  Pain/Pressure: Absent     Pelvic: Cervical exam deferred        Extremities: Normal range of motion.  Edema: None  Mental Status: Normal mood and affect. Normal behavior. Normal judgment and thought content.   Assessment and Plan:  Pregnancy: [id number] at [redacted]w[redacted]d 1. Supervision of other normal pregnancy, antepartum Anemia, needs another iron infusion prior to CS. No BTL. Want to go on leave due to discomforts  2. Previous cesarean delivery affecting pregnancy, antepartum RCS 39 weeks  Preterm labor symptoms and general obstetric precautions including but not limited to vaginal bleeding, contractions, leaking of fluid and fetal movement were reviewed in detail with the patient. Please refer to  After Visit Summary for other counseling recommendations.   Return in about 2 weeks (around 01/25/2021).  Future Appointments  Date Time Provider Department Center  01/15/2021  3:00 PM Gwyndolyn Saxon, [place] [facility] None  01/19/2021 10:45 AM [facility]SE [facility]  01/19/2021 11:00 AM [facility]1 [facility]  02/02/2021  2:30 PM [facility]  02/02/2021  2:45 PM [facility]6 [facility]    Scheryl Darter, MD

## 2021-01-11 NOTE — Progress Notes (Signed)
ROB [redacted]w[redacted]d  Wants to discuss maternity leave.   Pt wants to also taking iron/ iron infusions.

## 2021-01-15 ENCOUNTER — Ambulatory Visit (INDEPENDENT_AMBULATORY_CARE_PROVIDER_SITE_OTHER): Payer: Medicaid Other | Admitting: Licensed Clinical Social Worker

## 2021-01-15 DIAGNOSIS — F4321 Adjustment disorder with depressed mood: Secondary | ICD-10-CM

## 2021-01-16 ENCOUNTER — Other Ambulatory Visit (HOSPITAL_COMMUNITY): Payer: Self-pay | Admitting: *Deleted

## 2021-01-16 ENCOUNTER — Other Ambulatory Visit: Payer: Self-pay | Admitting: Obstetrics & Gynecology

## 2021-01-16 MED ORDER — SODIUM CHLORIDE 0.9 % IV SOLN: 500.0000 mg | Freq: Once | INTRAVENOUS | Status: DC

## 2021-01-16 NOTE — Progress Notes (Signed)
Meds ordered this encounter  Medications   iron sucrose (VENOFER) 500 mg in sodium chloride 0.9 % 250 mL IVPB   Ordered for tomorrow

## 2021-01-17 ENCOUNTER — Encounter (HOSPITAL_COMMUNITY)
Admission: RE | Admit: 2021-01-17 | Discharge: 2021-01-17 | Disposition: A | Discharge status: Home / Self Care | Payer: Medicaid Other | Source: Ambulatory Visit | Attending: Obstetrics & Gynecology | Admitting: Obstetrics & Gynecology

## 2021-01-17 ENCOUNTER — Other Ambulatory Visit: Payer: Self-pay

## 2021-01-17 DIAGNOSIS — O99013 Anemia complicating pregnancy, third trimester: Secondary | ICD-10-CM | POA: Diagnosis not present

## 2021-01-17 MED ORDER — SODIUM CHLORIDE 0.9 % IV SOLN
500.0000 mg | Freq: Once | INTRAVENOUS | Status: AC
  Administered 2021-01-17: 500 mg via INTRAVENOUS
  Filled 2021-01-17: qty 25

## 2021-01-19 ENCOUNTER — Encounter: Payer: Self-pay | Admitting: *Deleted

## 2021-01-19 ENCOUNTER — Ambulatory Visit: Payer: Medicaid Other | Attending: Obstetrics

## 2021-01-19 ENCOUNTER — Other Ambulatory Visit: Payer: Self-pay

## 2021-01-19 ENCOUNTER — Ambulatory Visit: Payer: Medicaid Other | Admitting: *Deleted

## 2021-01-19 VITALS — BP 105/59 | HR 95

## 2021-01-19 DIAGNOSIS — O34219 Maternal care for unspecified type scar from previous cesarean delivery: Secondary | ICD-10-CM

## 2021-01-19 DIAGNOSIS — Z148 Genetic carrier of other disease: Secondary | ICD-10-CM

## 2021-01-19 DIAGNOSIS — Z3480 Encounter for supervision of other normal pregnancy, unspecified trimester: Secondary | ICD-10-CM

## 2021-01-19 DIAGNOSIS — Z3A35 35 weeks gestation of pregnancy: Secondary | ICD-10-CM | POA: Diagnosis not present

## 2021-01-19 DIAGNOSIS — Z362 Encounter for other antenatal screening follow-up: Secondary | ICD-10-CM | POA: Insufficient documentation

## 2021-01-19 DIAGNOSIS — O99213 Obesity complicating pregnancy, third trimester: Secondary | ICD-10-CM

## 2021-01-19 DIAGNOSIS — Z6841 Body Mass Index (BMI) 40.0 and over, adult: Secondary | ICD-10-CM

## 2021-01-19 NOTE — BH Specialist Note (Signed)
Integrated Behavioral Health via Telemedicine Visit  01/19/2021 Debra Bell [id number]  Number of Integrated Behavioral Health visits: 1 Session Start time: 3:06pm  Session End time: 3:40pm Total time: 34 mins via mychart video   Referring Provider: Karolee Ohs MD Patient/Family location: Home  [facility] Provider location: [facility]  All persons participating in visit: Debra Bell and LCSWA A. Felton Clinton Types of Service: General Behavioral Integrated Care (BHI)  I connected with Debra Bell and/or Debra Bell n/a via  Telephone or Video Enabled Telemedicine Application  (Video is Caregility application) and verified that I am speaking with the correct person using two identifiers. Discussed confidentiality: Yes   I discussed the limitations of telemedicine and the availability of in person appointments.  Discussed there is a possibility of technology failure and discussed alternative modes of communication if that failure occurs.  I discussed that engaging in this telemedicine visit, they consent to the provision of behavioral healthcare and the services will be billed under their insurance.  Patient and/or legal guardian expressed understanding and consented to Telemedicine visit: Yes   Presenting Concerns: Patient and/or family reports the following symptoms/concerns: loss of interest and trouble sleeping  Duration of problem: approx two months; Severity of problem: mild  Patient and/or Family's Strengths/Protective Factors: Concrete supports in place (healthy food, safe environments, etc.)  Goals Addressed: Patient will:  Reduce symptoms of: insomnia, depressed Bell and lost of interest   Increase knowledge and/or ability of: coping skills   Demonstrate ability to: Increase healthy adjustment to current life circumstances  Progress towards Goals: Ongoing  Interventions: Interventions utilized:  supportive counseling  Standardized Assessments completed: PHQ  9  Assessment: Patient 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   Patient may benefit from integrated behavioral health.  Plan: Follow up with behavioral health clinician on : as needed  Behavioral recommendations: Prioritize rest, modify sleep hygiene and schedule self care into routine.  Referral(s): Integrated [employer] (In Clinic)  I discussed the assessment and treatment plan with the patient and/or parent/guardian. They were provided an opportunity to ask questions and all were answered. They agreed with the plan and demonstrated an understanding of the instructions.   They were advised to call back or seek an in-person evaluation if the symptoms worsen or if the condition fails to improve as anticipated.  Gwyndolyn Saxon, LCSW

## 2021-01-25 ENCOUNTER — Ambulatory Visit (INDEPENDENT_AMBULATORY_CARE_PROVIDER_SITE_OTHER): Payer: Medicaid Other | Admitting: Obstetrics & Gynecology

## 2021-01-25 ENCOUNTER — Other Ambulatory Visit: Payer: Self-pay

## 2021-01-25 ENCOUNTER — Other Ambulatory Visit (HOSPITAL_COMMUNITY)
Admission: RE | Admit: 2021-01-25 | Discharge: 2021-01-25 | Disposition: A | Discharge status: Home / Self Care | Payer: Medicaid Other | Source: Ambulatory Visit | Attending: Obstetrics & Gynecology | Admitting: Obstetrics & Gynecology

## 2021-01-25 VITALS — BP 108/72 | HR 95 | Wt 279.0 lb

## 2021-01-25 DIAGNOSIS — O34219 Maternal care for unspecified type scar from previous cesarean delivery: Secondary | ICD-10-CM

## 2021-01-25 DIAGNOSIS — Z3480 Encounter for supervision of other normal pregnancy, unspecified trimester: Secondary | ICD-10-CM

## 2021-01-25 DIAGNOSIS — D508 Other iron deficiency anemias: Secondary | ICD-10-CM

## 2021-01-25 DIAGNOSIS — Z3009 Encounter for other general counseling and advice on contraception: Secondary | ICD-10-CM

## 2021-01-25 NOTE — Progress Notes (Signed)
Patient reports fetal movement with some pressure. 

## 2021-01-25 NOTE — Progress Notes (Signed)
   PRENATAL VISIT NOTE  Subjective:  Debra Bell is a 30 y.o. [id number] at [redacted]w[redacted]d being seen today for ongoing prenatal care.  She is currently monitored for the following issues for this high-risk pregnancy and has Supervision of other normal pregnancy, antepartum; Previous cesarean delivery affecting pregnancy, antepartum; Unwanted fertility; and Iron deficiency anemia on their problem list.  Patient reports no complaints.  Contractions: Not present. Vag. Bleeding: None.  Movement: Present. Denies leaking of fluid.   The following portions of the patient's history were reviewed and updated as appropriate: allergies, current medications, past family history, past medical history, past social history, past surgical history and problem list.   Objective:   Vitals:   01/25/21 1343  BP: 108/72  Pulse: 95  Weight: 279 lb (126.6 kg)    Fetal Status: Fetal Heart Rate (bpm): 136   Movement: Present     General:  Alert, oriented and cooperative. Patient is in no acute distress.  Skin: Skin is warm and dry. No rash noted.   Cardiovascular: Normal heart rate noted  Respiratory: Normal respiratory effort, no problems with respiration noted  Abdomen: Soft, gravid, appropriate for gestational age.  Pain/Pressure: Present     Pelvic: Cervical exam deferred        Extremities: Normal range of motion.  Edema: Trace  Mental Status: Normal mood and affect. Normal behavior. Normal judgment and thought content.   Assessment and Plan:  Pregnancy: [id number] at [redacted]w[redacted]d 1. Unwanted fertility  Now declines BTL 2. Previous cesarean delivery affecting pregnancy, antepartum Repeat at 39 weeks, routine testing today 36 weeks  3. Supervision of other normal pregnancy, antepartum   4. Other iron deficiency anemia s/p Venofer x 2  Preterm labor symptoms and general obstetric precautions including but not limited to vaginal bleeding, contractions, leaking of fluid and fetal movement were reviewed in detail  with the patient. Please refer to After Visit Summary for other counseling recommendations.   Return in about 1 week (around 02/01/2021).  Future Appointments  Date Time Provider Department Center  02/02/2021  2:30 PM [facility] NURSE [facility]  02/02/2021  2:45 PM [facility] US6 [facility]    Scheryl Darter, MD

## 2021-01-25 NOTE — Addendum Note (Signed)
Addended by: Natale Milch D on: 01/25/2021 02:44 PM   Modules accepted: Orders

## 2021-01-25 NOTE — Patient Instructions (Signed)
Third Trimester of Pregnancy  The third trimester of pregnancy is from week 28 through week 40. This is also called months 7 through 9. This trimester is when your unborn baby (fetus) is growing very fast. At the end of the ninth month, the unborn baby is about20 inches long. It weighs about 6-10 pounds. Body changes during your third trimester Your body continues to go through many changes during this time. The changesvary and generally return to normal after the baby is born. Physical changes Your weight will continue to increase. You may gain 25-35 pounds (11-16 kg) by the end of the pregnancy. If you are underweight, you may gain 28-40 lb (about 13-18 kg). If you are overweight, you may gain 15-25 lb (about 7-11 kg). You may start to get stretch marks on your hips, belly (abdomen), and breasts. Your breasts will continue to grow and may hurt. A yellow fluid (colostrum) may leak from your breasts. This is the first milk you are making for your baby. You may have changes in your hair. Your belly button may stick out. You may have more swelling in your hands, face, or ankles. Health changes You may have heartburn. You may have trouble pooping (constipation). You may get hemorrhoids. These are swollen veins in the butt that can itch or get painful. You may have swollen veins (varicose veins) in your legs. You may have more body aches in the pelvis, back, or thighs. You may have more tingling or numbness in your hands, arms, and legs. The skin on your belly may also feel numb. You may feel short of breath as your womb (uterus) gets bigger. Other changes You may pee (urinate) more often. You may have more problems sleeping. You may notice the unborn baby "dropping," or moving lower in your belly. You may have more discharge coming from your vagina. Your joints may feel loose, and you may have pain around your pelvic bone. Follow these instructions at home: Medicines Take over-the-counter  and prescription medicines only as told by your doctor. Some medicines are not safe during pregnancy. Take a prenatal vitamin that contains at least 600 micrograms (mcg) of folic acid. Eating and drinking Eat healthy meals that include: Fresh fruits and vegetables. Whole grains. Good sources of protein, such as meat, eggs, or tofu. Low-fat dairy products. Avoid raw meat and unpasteurized juice, milk, and cheese. These carry germs that can harm you and your baby. Eat 4 or 5 small meals rather than 3 large meals a day. You may need to take these actions to prevent or treat trouble pooping: Drink enough fluids to keep your pee (urine) pale yellow. Eat foods that are high in fiber. These include beans, whole grains, and fresh fruits and vegetables. Limit foods that are high in fat and sugar. These include fried or sweet foods. Activity Exercise only as told by your doctor. Stop exercising if you start to have cramps in your womb. Avoid heavy lifting. Do not exercise if it is too hot or too humid, or if you are in a place of great height (high altitude). If you choose to, you may have sex unless your doctor tells you not to. Relieving pain and discomfort Take breaks often, and rest with your legs raised (elevated) if you have leg cramps or low back pain. Take warm water baths (sitz baths) to soothe pain or discomfort caused by hemorrhoids. Use hemorrhoid cream if your doctor approves. Wear a good support bra if your breasts are tender. If   you develop bulging, swollen veins in your legs: Wear support hose as told by your doctor. Raise your feet for 15 minutes, 3-4 times a day. Limit salt in your food. Safety Talk to your doctor before traveling far distances. Do not use hot tubs, steam rooms, or saunas. Wear your seat belt at all times when you are in a car. Talk with your doctor if someone is hurting you or yelling at you a lot. Preparing for your baby's arrival To prepare for the arrival  of your baby: Take prenatal classes. Visit the hospital and tour the maternity area. Buy a rear-facing car seat. Learn how to install it in your car. Prepare the baby's room. Take out all pillows and stuffed animals from the baby's crib. General instructions Avoid cat litter boxes and soil used by cats. These carry germs that can cause harm to the baby and can cause a loss of your baby by miscarriage or stillbirth. Do not douche or use tampons. Do not use scented sanitary pads. Do not smoke or use any products that contain nicotine or tobacco. If you need help quitting, ask your doctor. Do not drink alcohol. Do not use herbal medicines, illegal drugs, or medicines that were not approved by your doctor. Chemicals in these products can affect your baby. Keep all follow-up visits. This is important. Where to find more information American Pregnancy Association: americanpregnancy.org American College of Obstetricians and Gynecologists: www.acog.org Office on Women's Health: womenshealth.gov/pregnancy Contact a doctor if: You have a fever. You have mild cramps or pressure in your lower belly. You have a nagging pain in your belly area. You vomit, or you have watery poop (diarrhea). You have bad-smelling fluid coming from your vagina. You have pain when you pee, or your pee smells bad. You have a headache that does not go away when you take medicine. You have changes in how you see, or you see spots in front of your eyes. Get help right away if: Your water breaks. You have regular contractions that are less than 5 minutes apart. You are spotting or bleeding from your vagina. You have very bad belly cramps or pain. You have trouble breathing. You have chest pain. You faint. You have not felt the baby move for the amount of time told by your doctor. You have new or increased pain, swelling, or redness in an arm or leg. Summary The third trimester is from week 28 through week 40 (months 7  through 9). This is the time when your unborn baby is growing very fast. During this time, your discomfort may increase as you gain weight and as your baby grows. Get ready for your baby to arrive by taking prenatal classes, buying a rear-facing car seat, and preparing the baby's room. Get help right away if you are bleeding from your vagina, you have chest pain and trouble breathing, or you have not felt the baby move for the amount of time told by your doctor. This information is not intended to replace advice given to you by your health care provider. Make sure you discuss any questions you have with your healthcare provider. Document Revised: 12/01/2019 Document Reviewed: 10/07/2019 Elsevier Patient Education  2022 Elsevier Inc.  

## 2021-01-26 LAB — CERVICOVAGINAL ANCILLARY ONLY
Chlamydia: NEGATIVE
Comment: NEGATIVE
Comment: NORMAL
Neisseria Gonorrhea: NEGATIVE

## 2021-01-27 LAB — STREP GP B NAA: Strep Gp B NAA: POSITIVE — AB

## 2021-01-30 NOTE — Patient Instructions (Signed)
Xzaria Teo  01/30/2021   Your procedure is scheduled on:  02/14/2021  Arrive at 0730 at Entrance C on [address] at [facility]  and [employer]. You are invited to use the FREE valet parking or use the Visitor's parking deck.  Pick up the phone at the desk and dial [phone].  Call this number if you have problems the morning of surgery: [phone]  Remember:   Do not eat food:(After Midnight) Desps de medianoche.  Do not drink clear liquids: (After Midnight) Desps de medianoche.  Take these medicines the morning of surgery with A SIP OF WATER:  none   Do not wear jewelry, make-up or nail polish.  Do not wear lotions, powders, or perfumes. Do not wear deodorant.  Do not shave 48 hours prior to surgery.  Do not bring valuables to the hospital.  [facility] is not   responsible for any belongings or valuables brought to the hospital.  Contacts, dentures or bridgework may not be worn into surgery.  Leave suitcase in the car. After surgery it may be brought to your room.  For patients admitted to the hospital, checkout time is 11:00 AM the day of              discharge.      Please read over the following fact sheets that you were given:     Preparing for Surgery

## 2021-01-31 ENCOUNTER — Encounter (HOSPITAL_COMMUNITY): Payer: Self-pay

## 2021-02-01 ENCOUNTER — Other Ambulatory Visit: Payer: Self-pay

## 2021-02-01 ENCOUNTER — Ambulatory Visit (INDEPENDENT_AMBULATORY_CARE_PROVIDER_SITE_OTHER): Payer: Medicaid Other | Admitting: Obstetrics & Gynecology

## 2021-02-01 VITALS — BP 102/70 | HR 99 | Wt 280.0 lb

## 2021-02-01 DIAGNOSIS — D508 Other iron deficiency anemias: Secondary | ICD-10-CM

## 2021-02-01 DIAGNOSIS — O34219 Maternal care for unspecified type scar from previous cesarean delivery: Secondary | ICD-10-CM

## 2021-02-01 DIAGNOSIS — Z3480 Encounter for supervision of other normal pregnancy, unspecified trimester: Secondary | ICD-10-CM

## 2021-02-01 NOTE — Progress Notes (Signed)
   PRENATAL VISIT NOTE  Subjective:  Debra Bell is a 30 y.o. [id number] at [redacted]w[redacted]d being seen today for ongoing prenatal care.  She is currently monitored for the following issues for this high-risk pregnancy and has Supervision of other normal pregnancy, antepartum; Previous cesarean delivery affecting pregnancy, antepartum; Unwanted fertility; and Iron deficiency anemia on their problem list.  Patient reports no complaints.  Contractions: Not present. Vag. Bleeding: None.  Movement: Present. Denies leaking of fluid.   The following portions of the patient's history were reviewed and updated as appropriate: allergies, current medications, past family history, past medical history, past social history, past surgical history and problem list.   Objective:   Vitals:   02/01/21 1117  BP: 102/70  Pulse: 99  Weight: 280 lb (127 kg)    Fetal Status: Fetal Heart Rate (bpm): 135   Movement: Present     General:  Alert, oriented and cooperative. Patient is in no acute distress.  Skin: Skin is warm and dry. No rash noted.   Cardiovascular: Normal heart rate noted  Respiratory: Normal respiratory effort, no problems with respiration noted  Abdomen: Soft, gravid, appropriate for gestational age.  Pain/Pressure: Present     Pelvic: Cervical exam deferred        Extremities: Normal range of motion.  Edema: Trace  Mental Status: Normal mood and affect. Normal behavior. Normal judgment and thought content.   Assessment and Plan:  Pregnancy: [id number] at [redacted]w[redacted]d 1. Supervision of other normal pregnancy, antepartum RCS at 39  2. Other iron deficiency anemia S/p iron infusion  3. Previous cesarean delivery affecting pregnancy, antepartum x2  Term labor symptoms and general obstetric precautions including but not limited to vaginal bleeding, contractions, leaking of fluid and fetal movement were reviewed in detail with the patient. Please refer to After Visit Summary for other counseling  recommendations.   No follow-ups on file.  Future Appointments  Date Time Provider Department Center  02/02/2021  2:30 PM [facility] NURSE [facility]  02/02/2021  2:45 PM [facility] US6 [facility]  02/12/2021 10:00 AM [facility] PAT 1 [facility] None    Scheryl Darter, MD

## 2021-02-01 NOTE — Progress Notes (Signed)
+   fetal movement. No complaints.  

## 2021-02-02 ENCOUNTER — Other Ambulatory Visit: Payer: Self-pay | Admitting: *Deleted

## 2021-02-02 ENCOUNTER — Ambulatory Visit: Payer: Medicaid Other | Attending: Obstetrics and Gynecology

## 2021-02-02 ENCOUNTER — Ambulatory Visit: Payer: Medicaid Other | Admitting: *Deleted

## 2021-02-02 VITALS — BP 115/68 | HR 93

## 2021-02-02 DIAGNOSIS — O34219 Maternal care for unspecified type scar from previous cesarean delivery: Secondary | ICD-10-CM

## 2021-02-02 DIAGNOSIS — Z362 Encounter for other antenatal screening follow-up: Secondary | ICD-10-CM

## 2021-02-02 DIAGNOSIS — Z3A37 37 weeks gestation of pregnancy: Secondary | ICD-10-CM | POA: Diagnosis not present

## 2021-02-02 DIAGNOSIS — Z3480 Encounter for supervision of other normal pregnancy, unspecified trimester: Secondary | ICD-10-CM

## 2021-02-02 DIAGNOSIS — Z98891 History of uterine scar from previous surgery: Secondary | ICD-10-CM | POA: Insufficient documentation

## 2021-02-02 DIAGNOSIS — Z6839 Body mass index (BMI) 39.0-39.9, adult: Secondary | ICD-10-CM

## 2021-02-02 DIAGNOSIS — O99213 Obesity complicating pregnancy, third trimester: Secondary | ICD-10-CM

## 2021-02-02 DIAGNOSIS — Z148 Genetic carrier of other disease: Secondary | ICD-10-CM | POA: Diagnosis not present

## 2021-02-05 ENCOUNTER — Inpatient Hospital Stay (HOSPITAL_COMMUNITY): Payer: Medicaid Other | Admitting: Anesthesiology

## 2021-02-05 ENCOUNTER — Encounter (HOSPITAL_COMMUNITY): Payer: Self-pay | Admitting: Obstetrics and Gynecology

## 2021-02-05 ENCOUNTER — Other Ambulatory Visit: Payer: Self-pay

## 2021-02-05 ENCOUNTER — Inpatient Hospital Stay (HOSPITAL_COMMUNITY)
Admission: AD | Admit: 2021-02-05 | Discharge: 2021-02-08 | DRG: 786 | Disposition: A | Discharge status: Home / Self Care | Payer: Medicaid Other | Attending: Obstetrics and Gynecology | Admitting: Obstetrics and Gynecology

## 2021-02-05 ENCOUNTER — Encounter (HOSPITAL_COMMUNITY)
Admission: AD | Disposition: A | Discharge status: Home / Self Care | Payer: Self-pay | Source: Home / Self Care | Attending: Obstetrics & Gynecology

## 2021-02-05 DIAGNOSIS — O45029 Premature separation of placenta with disseminated intravascular coagulation, unspecified trimester: Secondary | ICD-10-CM | POA: Diagnosis present

## 2021-02-05 DIAGNOSIS — D62 Acute posthemorrhagic anemia: Secondary | ICD-10-CM | POA: Diagnosis not present

## 2021-02-05 DIAGNOSIS — O9081 Anemia of the puerperium: Secondary | ICD-10-CM | POA: Diagnosis not present

## 2021-02-05 DIAGNOSIS — Z3A37 37 weeks gestation of pregnancy: Secondary | ICD-10-CM | POA: Diagnosis not present

## 2021-02-05 DIAGNOSIS — O411230 Chorioamnionitis, third trimester, not applicable or unspecified: Secondary | ICD-10-CM | POA: Diagnosis not present

## 2021-02-05 DIAGNOSIS — Z3A00 Weeks of gestation of pregnancy not specified: Secondary | ICD-10-CM | POA: Diagnosis not present

## 2021-02-05 DIAGNOSIS — R578 Other shock: Secondary | ICD-10-CM

## 2021-02-05 DIAGNOSIS — D65 Disseminated intravascular coagulation [defibrination syndrome]: Secondary | ICD-10-CM

## 2021-02-05 DIAGNOSIS — J45909 Unspecified asthma, uncomplicated: Secondary | ICD-10-CM | POA: Diagnosis present

## 2021-02-05 DIAGNOSIS — O99214 Obesity complicating childbirth: Secondary | ICD-10-CM | POA: Diagnosis not present

## 2021-02-05 DIAGNOSIS — Z98891 History of uterine scar from previous surgery: Secondary | ICD-10-CM

## 2021-02-05 DIAGNOSIS — Z419 Encounter for procedure for purposes other than remedying health state, unspecified: Secondary | ICD-10-CM | POA: Diagnosis not present

## 2021-02-05 DIAGNOSIS — O4593 Premature separation of placenta, unspecified, third trimester: Secondary | ICD-10-CM | POA: Diagnosis not present

## 2021-02-05 DIAGNOSIS — Z30430 Encounter for insertion of intrauterine contraceptive device: Secondary | ICD-10-CM | POA: Diagnosis not present

## 2021-02-05 DIAGNOSIS — O34211 Maternal care for low transverse scar from previous cesarean delivery: Secondary | ICD-10-CM | POA: Diagnosis not present

## 2021-02-05 DIAGNOSIS — Z302 Encounter for sterilization: Secondary | ICD-10-CM | POA: Diagnosis not present

## 2021-02-05 DIAGNOSIS — O45023 Premature separation of placenta with disseminated intravascular coagulation, third trimester: Secondary | ICD-10-CM | POA: Diagnosis present

## 2021-02-05 DIAGNOSIS — O34219 Maternal care for unspecified type scar from previous cesarean delivery: Secondary | ICD-10-CM | POA: Diagnosis not present

## 2021-02-05 DIAGNOSIS — Z20822 Contact with and (suspected) exposure to covid-19: Secondary | ICD-10-CM | POA: Diagnosis not present

## 2021-02-05 DIAGNOSIS — O9952 Diseases of the respiratory system complicating childbirth: Secondary | ICD-10-CM | POA: Diagnosis not present

## 2021-02-05 HISTORY — DX: Unspecified ovarian cyst, right side: N83.201

## 2021-02-05 HISTORY — DX: Unspecified ovarian cyst, right side: N83.202

## 2021-02-05 LAB — APTT: aPTT: 30 seconds (ref 24–36)

## 2021-02-05 LAB — DIC (DISSEMINATED INTRAVASCULAR COAGULATION)PANEL
D-Dimer, Quant: >20 ug/mL-FEU — ABNORMAL HIGH (ref 0.00–0.50)
Fibrinogen: 171 mg/dL — ABNORMAL LOW (ref 210–475)
INR: 1.5 — ABNORMAL HIGH (ref 0.8–1.2)
Platelets: 120 10*3/uL — ABNORMAL LOW (ref 150–400)
Prothrombin Time: 17.6 seconds — ABNORMAL HIGH (ref 11.4–15.2)
Smear Review: NONE SEEN
aPTT: 41 seconds — ABNORMAL HIGH (ref 24–36)

## 2021-02-05 LAB — PREPARE RBC (CROSSMATCH)

## 2021-02-05 LAB — RESP PANEL BY RT-PCR (FLU A&B, COVID) ARPGX2
Influenza A by PCR: NEGATIVE
Influenza B by PCR: NEGATIVE
SARS Coronavirus 2 by RT PCR: NEGATIVE

## 2021-02-05 LAB — CBC
HCT: 35.1 % — ABNORMAL LOW (ref 36.0–46.0)
HCT: 36.1 % (ref 36.0–46.0)
Hemoglobin: 10.9 g/dL — ABNORMAL LOW (ref 12.0–15.0)
Hemoglobin: 11.7 g/dL — ABNORMAL LOW (ref 12.0–15.0)
MCH: 25.3 pg — ABNORMAL LOW (ref 26.0–34.0)
MCH: 27.1 pg (ref 26.0–34.0)
MCHC: 31.1 g/dL (ref 30.0–36.0)
MCHC: 32.4 g/dL (ref 30.0–36.0)
MCV: 81.6 fL (ref 80.0–100.0)
MCV: 83.8 fL (ref 80.0–100.0)
Platelets: 195 10*3/uL (ref 150–400)
Platelets: 120 10*3/uL — ABNORMAL LOW (ref 150–400)
RBC: 4.3 MIL/uL (ref 3.87–5.11)
RBC: 4.31 MIL/uL (ref 3.87–5.11)
RDW: 18.8 % — ABNORMAL HIGH (ref 11.5–15.5)
RDW: 17.4 % — ABNORMAL HIGH (ref 11.5–15.5)
WBC: 7.5 10*3/uL (ref 4.0–10.5)
WBC: 14.4 10*3/uL — ABNORMAL HIGH (ref 4.0–10.5)
nRBC: 0 % (ref 0.0–0.2)
nRBC: 0 % (ref 0.0–0.2)

## 2021-02-05 LAB — ABO/RH: ABO/RH(D): B POS

## 2021-02-05 LAB — PROTIME-INR
INR: 1.1 (ref 0.8–1.2)
Prothrombin Time: 14.1 seconds (ref 11.4–15.2)

## 2021-02-05 LAB — GLUCOSE, CAPILLARY: Glucose-Capillary: 113 mg/dL — ABNORMAL HIGH (ref 70–99)

## 2021-02-05 LAB — FIBRINOGEN: Fibrinogen: 446 mg/dL (ref 210–475)

## 2021-02-05 SURGERY — Surgical Case
Anesthesia: Spinal

## 2021-02-05 MED ORDER — LACTATED RINGERS IV SOLN: INTRAVENOUS | Status: DC

## 2021-02-05 MED ORDER — DEXAMETHASONE SODIUM PHOSPHATE 10 MG/ML IJ SOLN
INTRAMUSCULAR | Status: DC | PRN
  Administered 2021-02-05: 10 mg via INTRAVENOUS

## 2021-02-05 MED ORDER — ZOLPIDEM TARTRATE 5 MG PO TABS: 5.0000 mg | ORAL_TABLET | Freq: Every evening | ORAL | Status: DC | PRN

## 2021-02-05 MED ORDER — FENTANYL CITRATE (PF) 100 MCG/2ML IJ SOLN
INTRAMUSCULAR | Status: DC | PRN
  Administered 2021-02-05: 15 ug via INTRAVENOUS

## 2021-02-05 MED ORDER — ACETAMINOPHEN 10 MG/ML IV SOLN
1000.0000 mg | Freq: Once | INTRAVENOUS | Status: AC
  Administered 2021-02-05: 1000 mg via INTRAVENOUS

## 2021-02-05 MED ORDER — BUPIVACAINE HCL (PF) 0.5 % IJ SOLN
INTRAMUSCULAR | Status: AC
  Filled 2021-02-05: qty 30

## 2021-02-05 MED ORDER — MENTHOL 3 MG MT LOZG
1.0000 | LOZENGE | OROMUCOSAL | Status: DC | PRN
  Filled 2021-02-05: qty 9

## 2021-02-05 MED ORDER — TRANEXAMIC ACID-NACL 1000-0.7 MG/100ML-% IV SOLN
INTRAVENOUS | Status: AC
  Filled 2021-02-05: qty 100

## 2021-02-05 MED ORDER — ACETAMINOPHEN 10 MG/ML IV SOLN
INTRAVENOUS | Status: AC
  Filled 2021-02-05: qty 100

## 2021-02-05 MED ORDER — BUPIVACAINE HCL (PF) 0.25 % IJ SOLN
INTRAMUSCULAR | Status: AC
  Filled 2021-02-05: qty 30

## 2021-02-05 MED ORDER — TRANEXAMIC ACID-NACL 1000-0.7 MG/100ML-% IV SOLN: 1000.0000 mg | INTRAVENOUS | Status: DC

## 2021-02-05 MED ORDER — SODIUM CHLORIDE 0.9 % IV SOLN
INTRAVENOUS | Status: DC | PRN
Start: 2021-02-05 — End: 2021-02-05

## 2021-02-05 MED ORDER — CEFAZOLIN IN SODIUM CHLORIDE 3-0.9 GM/100ML-% IV SOLN
3.0000 g | INTRAVENOUS | Status: DC
  Filled 2021-02-05 (×2): qty 100

## 2021-02-05 MED ORDER — POVIDONE-IODINE 10 % EX SWAB: 2.0000 "application " | Freq: Once | CUTANEOUS | Status: DC

## 2021-02-05 MED ORDER — LACTATED RINGERS IV BOLUS
1000.0000 mL | Freq: Once | INTRAVENOUS | Status: AC
  Administered 2021-02-05: 1000 mL via INTRAVENOUS

## 2021-02-05 MED ORDER — PHENYLEPHRINE HCL-NACL 20-0.9 MG/250ML-% IV SOLN
INTRAVENOUS | Status: AC
  Filled 2021-02-05: qty 250

## 2021-02-05 MED ORDER — TETANUS-DIPHTH-ACELL PERTUSSIS 5-2.5-18.5 LF-MCG/0.5 IM SUSY
0.5000 mL | PREFILLED_SYRINGE | Freq: Once | INTRAMUSCULAR | Status: DC
Start: 2021-02-06 — End: 2021-02-08
  Filled 2021-02-05: qty 0.5

## 2021-02-05 MED ORDER — DIBUCAINE (PERIANAL) 1 % EX OINT
1.0000 "application " | TOPICAL_OINTMENT | CUTANEOUS | Status: DC | PRN
  Filled 2021-02-05: qty 28

## 2021-02-05 MED ORDER — BUPIVACAINE IN DEXTROSE 0.75-8.25 % IT SOLN
INTRATHECAL | Status: DC | PRN
  Administered 2021-02-05: 1.4 mL via INTRATHECAL

## 2021-02-05 MED ORDER — ONDANSETRON HCL 4 MG/2ML IJ SOLN
INTRAMUSCULAR | Status: AC
  Filled 2021-02-05: qty 2

## 2021-02-05 MED ORDER — ALBUTEROL SULFATE (2.5 MG/3ML) 0.083% IN NEBU: 2.5000 mg | INHALATION_SOLUTION | RESPIRATORY_TRACT | Status: DC | PRN

## 2021-02-05 MED ORDER — SODIUM CHLORIDE 0.9% IV SOLUTION: Freq: Once | INTRAVENOUS | Status: DC

## 2021-02-05 MED ORDER — LEVONORGESTREL 20.1 MCG/DAY IU IUD
INTRAUTERINE_SYSTEM | INTRAUTERINE | Status: AC
  Filled 2021-02-05: qty 1

## 2021-02-05 MED ORDER — WITCH HAZEL-GLYCERIN EX PADS
1.0000 "application " | MEDICATED_PAD | CUTANEOUS | Status: DC | PRN
  Filled 2021-02-05: qty 100

## 2021-02-05 MED ORDER — PHENYLEPHRINE HCL (PRESSORS) 10 MG/ML IV SOLN
INTRAVENOUS | Status: DC | PRN
  Administered 2021-02-05 (×2): 120 ug via INTRAVENOUS
  Administered 2021-02-05: 160 ug via INTRAVENOUS
  Administered 2021-02-05 (×2): 120 ug via INTRAVENOUS
  Administered 2021-02-05: 80 ug via INTRAVENOUS

## 2021-02-05 MED ORDER — ENOXAPARIN SODIUM 40 MG/0.4ML IJ SOSY: 40.0000 mg | PREFILLED_SYRINGE | INTRAMUSCULAR | Status: DC

## 2021-02-05 MED ORDER — DEXTROSE 5 % IV SOLN
INTRAVENOUS | Status: DC | PRN
  Administered 2021-02-05 (×2): 3 g via INTRAVENOUS

## 2021-02-05 MED ORDER — CEFAZOLIN SODIUM-DEXTROSE 2-4 GM/100ML-% IV SOLN
2.0000 g | Freq: Three times a day (TID) | INTRAVENOUS | Status: AC
  Administered 2021-02-06 (×3): 2 g via INTRAVENOUS
  Filled 2021-02-05 (×4): qty 100

## 2021-02-05 MED ORDER — MEASLES, MUMPS & RUBELLA VAC IJ SOLR
0.5000 mL | Freq: Once | INTRAMUSCULAR | Status: DC
  Filled 2021-02-05: qty 0.5

## 2021-02-05 MED ORDER — TERBUTALINE SULFATE 1 MG/ML IJ SOLN
0.2500 mg | Freq: Once | INTRAMUSCULAR | Status: AC
  Administered 2021-02-05: 0.25 mg via SUBCUTANEOUS
  Filled 2021-02-05: qty 1

## 2021-02-05 MED ORDER — ONDANSETRON HCL 4 MG/2ML IJ SOLN
INTRAMUSCULAR | Status: DC | PRN
  Administered 2021-02-05: 4 mg via INTRAVENOUS

## 2021-02-05 MED ORDER — COCONUT OIL OIL
1.0000 "application " | TOPICAL_OIL | Status: DC | PRN
  Administered 2021-02-06: 1 via TOPICAL
  Filled 2021-02-05: qty 120

## 2021-02-05 MED ORDER — ACETAMINOPHEN 500 MG PO TABS
1000.0000 mg | ORAL_TABLET | Freq: Four times a day (QID) | ORAL | Status: DC
  Administered 2021-02-06 – 2021-02-08 (×8): 1000 mg via ORAL
  Filled 2021-02-05 (×10): qty 2

## 2021-02-05 MED ORDER — PHENYLEPHRINE HCL-NACL 20-0.9 MG/250ML-% IV SOLN
INTRAVENOUS | Status: DC | PRN
  Administered 2021-02-05: 60 ug/min via INTRAVENOUS

## 2021-02-05 MED ORDER — DEXAMETHASONE SODIUM PHOSPHATE 10 MG/ML IJ SOLN
INTRAMUSCULAR | Status: AC
  Filled 2021-02-05: qty 1

## 2021-02-05 MED ORDER — OXYCODONE HCL 5 MG PO TABS
5.0000 mg | ORAL_TABLET | ORAL | Status: DC | PRN
  Administered 2021-02-06: 10 mg via ORAL
  Administered 2021-02-06: 5 mg via ORAL
  Administered 2021-02-07: 10 mg via ORAL
  Administered 2021-02-07: 5 mg via ORAL
  Administered 2021-02-08: 10 mg via ORAL
  Administered 2021-02-08: 5 mg via ORAL
  Filled 2021-02-05: qty 1
  Filled 2021-02-05 (×2): qty 2
  Filled 2021-02-05: qty 1
  Filled 2021-02-05: qty 2
  Filled 2021-02-05: qty 1

## 2021-02-05 MED ORDER — OXYTOCIN-SODIUM CHLORIDE 30-0.9 UT/500ML-% IV SOLN
2.5000 [IU]/h | INTRAVENOUS | Status: AC
  Filled 2021-02-05: qty 500

## 2021-02-05 MED ORDER — FENTANYL CITRATE (PF) 100 MCG/2ML IJ SOLN
INTRAMUSCULAR | Status: AC
  Filled 2021-02-05: qty 2

## 2021-02-05 MED ORDER — SIMETHICONE 80 MG PO CHEW
80.0000 mg | CHEWABLE_TABLET | ORAL | Status: DC | PRN
  Administered 2021-02-06 – 2021-02-07 (×5): 80 mg via ORAL
  Filled 2021-02-05 (×5): qty 1

## 2021-02-05 MED ORDER — PRENATAL MULTIVITAMIN CH
1.0000 | ORAL_TABLET | Freq: Every day | ORAL | Status: DC
  Administered 2021-02-06 – 2021-02-08 (×3): 1 via ORAL
  Filled 2021-02-05 (×3): qty 1

## 2021-02-05 MED ORDER — SENNOSIDES-DOCUSATE SODIUM 8.6-50 MG PO TABS
2.0000 | ORAL_TABLET | Freq: Every day | ORAL | Status: DC
  Administered 2021-02-06 – 2021-02-08 (×3): 2 via ORAL
  Filled 2021-02-05 (×3): qty 2

## 2021-02-05 MED ORDER — SODIUM CHLORIDE 0.9 % IV SOLN
500.0000 mg | INTRAVENOUS | Status: AC
  Administered 2021-02-05: 500 mg via INTRAVENOUS
  Filled 2021-02-05: qty 500

## 2021-02-05 MED ORDER — TRANEXAMIC ACID-NACL 1000-0.7 MG/100ML-% IV SOLN
INTRAVENOUS | Status: DC | PRN
  Administered 2021-02-05: 1000 mg via INTRAVENOUS

## 2021-02-05 MED ORDER — LEVONORGESTREL 20.1 MCG/DAY IU IUD
1.0000 | INTRAUTERINE_SYSTEM | Freq: Once | INTRAUTERINE | Status: AC
  Administered 2021-02-05: 1 via INTRAUTERINE

## 2021-02-05 MED ORDER — OXYTOCIN-SODIUM CHLORIDE 30-0.9 UT/500ML-% IV SOLN
INTRAVENOUS | Status: DC | PRN
  Administered 2021-02-05: 500 mL via INTRAVENOUS

## 2021-02-05 MED ORDER — GABAPENTIN 300 MG PO CAPS
300.0000 mg | ORAL_CAPSULE | Freq: Two times a day (BID) | ORAL | Status: DC
  Administered 2021-02-06 – 2021-02-08 (×5): 300 mg via ORAL
  Filled 2021-02-05 (×5): qty 1

## 2021-02-05 MED ORDER — CHLORHEXIDINE GLUCONATE CLOTH 2 % EX PADS
6.0000 | MEDICATED_PAD | Freq: Every day | CUTANEOUS | Status: DC
  Administered 2021-02-06: 6 via TOPICAL

## 2021-02-05 MED ORDER — MORPHINE SULFATE (PF) 0.5 MG/ML IJ SOLN
INTRAMUSCULAR | Status: DC | PRN
  Administered 2021-02-05: .15 mg via EPIDURAL

## 2021-02-05 MED ORDER — HYDROMORPHONE HCL 1 MG/ML IJ SOLN
1.0000 mg | INTRAMUSCULAR | Status: DC | PRN
  Administered 2021-02-06 – 2021-02-07 (×2): 1 mg via INTRAVENOUS
  Filled 2021-02-05: qty 1
  Filled 2021-02-05: qty 2

## 2021-02-05 MED ORDER — FERROUS SULFATE 325 (65 FE) MG PO TABS
325.0000 mg | ORAL_TABLET | ORAL | Status: DC
  Administered 2021-02-06 – 2021-02-08 (×2): 325 mg via ORAL
  Filled 2021-02-05 (×2): qty 1

## 2021-02-05 MED ORDER — DIPHENHYDRAMINE HCL 25 MG PO CAPS
25.0000 mg | ORAL_CAPSULE | Freq: Four times a day (QID) | ORAL | Status: DC | PRN
  Administered 2021-02-06 (×2): 25 mg via ORAL
  Filled 2021-02-05 (×2): qty 1

## 2021-02-05 MED ORDER — METHYLERGONOVINE MALEATE 0.2 MG/ML IJ SOLN
INTRAMUSCULAR | Status: DC | PRN
  Administered 2021-02-05: .2 mg via INTRAMUSCULAR

## 2021-02-05 MED ORDER — SOD CITRATE-CITRIC ACID 500-334 MG/5ML PO SOLN
30.0000 mL | Freq: Once | ORAL | Status: AC
  Administered 2021-02-05: 30 mL via ORAL
  Filled 2021-02-05: qty 30

## 2021-02-05 MED ORDER — MAGNESIUM HYDROXIDE 400 MG/5ML PO SUSP: 30.0000 mL | ORAL | Status: DC | PRN

## 2021-02-05 MED ORDER — OXYCODONE-ACETAMINOPHEN 5-325 MG PO TABS
2.0000 | ORAL_TABLET | ORAL | Status: DC | PRN
  Administered 2021-02-06: 2 via ORAL
  Filled 2021-02-05 (×2): qty 2

## 2021-02-05 MED ORDER — MORPHINE SULFATE (PF) 0.5 MG/ML IJ SOLN
INTRAMUSCULAR | Status: AC
  Filled 2021-02-05: qty 10

## 2021-02-05 SURGICAL SUPPLY — 35 items
BENZOIN TINCTURE PRP APPL 2/3 (GAUZE/BANDAGES/DRESSINGS) ×2 IMPLANT
CHLORAPREP W/TINT 26ML (MISCELLANEOUS) ×2 IMPLANT
CLAMP CORD UMBIL (MISCELLANEOUS) IMPLANT
CLOTH BEACON ORANGE TIMEOUT ST (SAFETY) ×2 IMPLANT
DRSG OPSITE POSTOP 4X10 (GAUZE/BANDAGES/DRESSINGS) ×2 IMPLANT
ELECT REM PT RETURN 9FT ADLT (ELECTROSURGICAL) ×2
ELECTRODE REM PT RTRN 9FT ADLT (ELECTROSURGICAL) ×1 IMPLANT
EXTRACTOR VACUUM M CUP 4 TUBE (SUCTIONS) IMPLANT
GAUZE SPONGE 4X4 12PLY STRL LF (GAUZE/BANDAGES/DRESSINGS) ×4 IMPLANT
GLOVE BIOGEL PI IND STRL 7.0 (GLOVE) ×2 IMPLANT
GLOVE BIOGEL PI IND STRL 7.5 (GLOVE) ×2 IMPLANT
GLOVE BIOGEL PI INDICATOR 7.0 (GLOVE) ×2
GLOVE BIOGEL PI INDICATOR 7.5 (GLOVE) ×2
GLOVE ECLIPSE 7.5 STRL STRAW (GLOVE) ×2 IMPLANT
GOWN STRL REUS W/TWL LRG LVL3 (GOWN DISPOSABLE) ×6 IMPLANT
KIT ABG SYR 3ML LUER SLIP (SYRINGE) ×2 IMPLANT
NEEDLE HYPO 25X5/8 SAFETYGLIDE (NEEDLE) ×2 IMPLANT
NS IRRIG 1000ML POUR BTL (IV SOLUTION) ×2 IMPLANT
PACK C SECTION WH (CUSTOM PROCEDURE TRAY) ×2 IMPLANT
PAD ABD 7.5X8 STRL (GAUZE/BANDAGES/DRESSINGS) ×2 IMPLANT
PAD OB MATERNITY 4.3X12.25 (PERSONAL CARE ITEMS) ×2 IMPLANT
PENCIL SMOKE EVAC W/HOLSTER (ELECTROSURGICAL) ×2 IMPLANT
RTRCTR C-SECT PINK 25CM LRG (MISCELLANEOUS) ×2 IMPLANT
STRIP CLOSURE SKIN 1/2X4 (GAUZE/BANDAGES/DRESSINGS) ×2 IMPLANT
SUT PDS AB 0 CTX 60 (SUTURE) ×2 IMPLANT
SUT PLAIN 0 NONE (SUTURE) ×2 IMPLANT
SUT PLAIN 2 0 XLH (SUTURE) ×2 IMPLANT
SUT VIC AB 0 CT1 36 (SUTURE) ×2 IMPLANT
SUT VIC AB 2-0 CT1 (SUTURE) ×6 IMPLANT
SUT VIC AB 2-0 CT1 27 (SUTURE) ×1
SUT VIC AB 2-0 CT1 TAPERPNT 27 (SUTURE) ×1 IMPLANT
SUT VIC AB 4-0 KS 27 (SUTURE) ×2 IMPLANT
TOWEL OR 17X24 6PK STRL BLUE (TOWEL DISPOSABLE) ×2 IMPLANT
TRAY FOLEY W/BAG SLVR 14FR LF (SET/KITS/TRAYS/PACK) ×2 IMPLANT
WATER STERILE IRR 1000ML POUR (IV SOLUTION) ×2 IMPLANT

## 2021-02-05 NOTE — Op Note (Addendum)
Debra Bell PROCEDURE DATE: 02/05/2021  PREOPERATIVE DIAGNOSES: Intrauterine pregnancy at [redacted]w[redacted]d weeks gestation;  two previous cesarean sections; term labor ; desires reversible long term contraception  POSTOPERATIVE DIAGNOSES: Placental abruption with disseminated intravascular coagulation; postpartum hemorrhage; hemorrhagic shock; same as above  PROCEDURE: Repeat Low Transverse Cesarean Section and Liletta Intrauterine Device Placement  SURGEON:  Dr. Jaynie Collins  ANESTHESIOLOGY TEAM: Anesthesiologist: Leilani Able, MD CRNA: Elbert Ewings, CRNA  INDICATIONS: Debra Bell is a 30 y.o. [phone] at [redacted]w[redacted]d here for surgery secondary to the indications listed under preoperative diagnoses; please see preoperative note for further details.   The risks of surgery were discussed with the patient including but were not limited to: bleeding which may require transfusion or reoperation; infection which may require antibiotics; injury to bowel, bladder, ureters or other surrounding organs; injury to the fetus; need for additional procedures including hysterectomy in the event of a life-threatening hemorrhage; formation of adhesions; placental abnormalities wth subsequent pregnancies; incisional problems; thromboembolic phenomenon and other postoperative/anesthesia complications.   For the postplacental IUD placement, discussed risks of postpartum expulsion, irregular bleeding, cramping, infection, malpositioning or misplacement of the IUD which may require further procedures. Also discussed >99% contraception efficacy, increased risk of ectopic pregnancy with failure of method.   The patient concurred with the proposed plan, giving informed written consent for the procedures.  FINDINGS:  After patient received spinal anesthesia, she had a large amount of blood loss noted. The surgery was upgraded to a STAT, Code Cesarean was called and adequate support was received.  There was delivery of a viable  female infant in cephalic presentation.  Apgars 7 and 10, arterial cord pH 7.02.  Large placental abruption noted.  Bloody amniotic fluid.  Intact placenta, three vessel cord.  Normal uterus, fallopian tubes and ovaries bilaterally. Liletta IUD was able to be placed prior to hysterotomy closure.Patient noted to have "oozing" from every surface towards the end of the surgery concerning for coagulopathy. Massive Transfusion Protocol was initiated.   Patient had to receive pressors during the surgery to maintain her BP, and received multiple blood products.  ANESTHESIA: Spinal INTRAVENOUS FLUIDS: 4 units PRBCs, 2 units FFP, 1 unit platelets, 2600 ml crystalloid ESTIMATED BLOOD LOSS: 2452 ml quantified (about 3 L estimated by surgeon) URINE OUTPUT:  300 ml SPECIMENS: Placenta sent to pathology DRAINS: JP placed in subcutaneous tissue, urinary foley catheter COMPLICATIONS: None immediate  PROCEDURE IN DETAIL:  The patient preoperatively received intravenous antibiotics and had sequential compression devices applied to her lower extremities.  She was then taken to the operating room where spinal anesthesia was administered and was found to be adequate. She was noted to have a large amount of blood loss at this point, fetal heart rate noted to be in the 90s. Code Cesarean was called.  She was then placed in a dorsal supine position with a leftward tilt, prepped quickly with betadine and draped in a sterile manner. A foley catheter was quickly placed into her bladder and attached to constant gravity.  After a timeout was performed, a Pfannenstiel skin incision was made with scalpel and carried through to the underlying layer of fascia. The fascial incision was extended bilaterally in a blunt and sharp fashion.  The fascia was noted to be adherent to the rectus muscles, peritoneum and omentum so the peritoneal cavity was entered bluntly.  Attention was turned to the lower uterine segment where a low transverse  hysterotomy was made with a scalpel and extended bilaterally bluntly. Large abruption was  immediately noted. The infant was successfully delivered, the cord was clamped and cut and the infant was handed over to awaiting neonatology team. Incision to delivery time was about one minute.  The placenta was delivered intact and had a three-vessel cord. The uterus was then cleared of multiple clots and debris.   The Liletta IUD was placed in the fundal region, and the strings were pushed through the lower uterine segment into cervix and upper vagina. The hysterotomy was closed with 0 Vicryl in a running locked fashion, and an imbricating layer was also placed with 0 Vicryl. The pelvis was cleared of all clot and debris. Hemostasis was confirmed on the uterine surface but all surfaces kept "oozing".  The fascia was then closed using locked 0 PDS in a running fashion.  The subcutaneous layer was noted to have a lot of oozing, so a JP drain was placed there and brought out to the right side of the incision.  2-0 plain gut interrupted sutures were placed in subcutaneous area.  The skin was closed with staples and the JP drain was secured with a 2-0 silk stitch.   The patient tolerated the procedure well.  She never had to be intubated; her airway remained secure throughout procedure.  Sponge, instrument and needle counts were correct x 3.  She was taken to the recovery room in stable condition.    The plan is to observe her overnight in the ICU given concern about her blood loss and coagulopathy.  Discussed this with Dr. [name] (Critical Care Medicine) who agrees with this plan.  ICU transfer orders placed. Will continue close observation.     Jaynie Collins, MD, FACOG Obstetrician & Gynecologist, [facility] for [employer], [facility]

## 2021-02-05 NOTE — Consult Note (Signed)
NAME:  Ryin Schillo, MRN:  [id number], DOB:  16-Jun-1991, LOS: 0 ADMISSION DATE:  02/05/2021, CONSULTATION DATE:  02/05/21 REFERRING MD:  Macon Large CHIEF COMPLAINT:  Placental Abruption   History of Present Illness:  Cynara Tatham is a 30 y.o. female [phone] with IUP at [redacted]w[redacted]d  who has two previous C-sections.  She presented to [facility] 8/1 with vaginal bleeding and feelings of water breaking along with frequent 2-3 min contractions with good fetal movement.  She was admitted by [facility] and taken to OR for C-section and post delivery IUD placement.  Intraop, she was found to have placental abruption with hemorrhagic shock (3L of blood loss).  She was transfused 4u PRBC, 2 FFP, 1u platelets. Intraop she was on phenylephrine briefly, now stopped.  Post op while in PACU, fibrinogen low at 171 for which cryoprecipitate was ordered.  PCCM was called in consultation and it was requested that she be transferred to the ICU for overnight observation.   Pertinent  Medical History:  has Supervision of other normal pregnancy, antepartum; Previous cesarean delivery affecting pregnancy, antepartum; Iron deficiency anemia; S/P cesarean section; Postpartum hemorrhage of 3 L; and Placental abruption with disseminated intravascular coagulation, delivered on their problem list.  Significant Hospital Events: Including procedures, antibiotic start and stop dates in addition to other pertinent events   8/1 > admit for C-section and post delivery IUD placement.  Complicated by placental abruption with hemorrhagic shock after 3L EBL.   Interim History / Subjective:  Comfortable.  Has mild abd pain but tolerable.  Eager to see her baby.  Objective:  Blood pressure 107/79, pulse 90, temperature 98.2 F (36.8 C), resp. rate 20, last menstrual period 05/08/2020, SpO2 97 %, unknown if currently breastfeeding.        Intake/Output Summary (Last 24 hours) at 02/05/2021 2226 Last data filed at 02/05/2021 2200 Gross per 24 hour   Intake 4954 ml  Output 2802 ml  Net 2152 ml   There were no vitals filed for this visit.  Examination: General: Adult female, resting in bed, in NAD. Neuro: A&O x 3, no deficits. HEENT: [place]/AT. Sclerae anicteric. EOMI. Cardiovascular: RRR, no M/R/G.  Lungs: Respirations even and unlabored.  CTA bilaterally, No W/R/R.  Abdomen: JP drain noted with minimal output.  Abd soft and tender. Musculoskeletal: No gross deformities, no edema.  Skin: Intact, warm, no rashes.  Labs/imaging personally reviewed:   Resolved Hospital Problem list:   Assessment & Plan:   Placental Abruption with hemorrhagic shock - s/p emergent C-section. - Post op care per OB. - Have asked PACU RN to assist in facilitating nursery to bring pt's newly born son to visit with pt and husband prior to her transfer to ICU.  Acute blood loss anemia 2/2 above- s/p 4u PRBC, 2u FFP, 1u platelet transfusion. DIC - 2/2 above. - Cryo ordered by primary team. - Follow H/H. - Monitor for ongoing bleeding.  Hx asthma. - BD's PRN.   Best practice (evaluated daily):  Diet/type: NPO DVT prophylaxis: SCD GI prophylaxis: N/A Lines: N/A Foley:  N/A Code Status:  full code Last date of multidisciplinary goals of care discussion: Per Primary.  Labs   CBC: Recent Labs  Lab 02/05/21 1659 02/05/21 2135  WBC 7.5 14.4*  HGB 10.9* 11.7*  HCT 35.1* 36.1  MCV 81.6 83.8  PLT 195 120*  120*    Basic Metabolic Panel: No results for input(s): NA, K, CL, CO2, GLUCOSE, BUN, CREATININE, CALCIUM, MG, PHOS in the last 168 hours. GFR:  CrCl cannot be calculated (No successful lab value found.). Recent Labs  Lab 02/05/21 1659 02/05/21 2135  WBC 7.5 14.4*    Liver Function Tests: No results for input(s): AST, ALT, ALKPHOS, BILITOT, PROT, ALBUMIN in the last 168 hours. No results for input(s): LIPASE, AMYLASE in the last 168 hours. No results for input(s): AMMONIA in the last 168 hours.  ABG No results found for:  PHART, PCO2ART, PO2ART, HCO3, TCO2, ACIDBASEDEF, O2SAT   Coagulation Profile: Recent Labs  Lab 02/05/21 1514 02/05/21 2135  INR 1.1 1.5*    Cardiac Enzymes: No results for input(s): CKTOTAL, CKMB, CKMBINDEX, TROPONINI in the last 168 hours.  HbA1C: No results found for: HGBA1C  CBG: No results for input(s): GLUCAP in the last 168 hours.  Review of Systems:   All negative; except for those that are bolded, which indicate positives.  Constitutional: weight loss, weight gain, night sweats, fevers, chills, fatigue, weakness.  HEENT: headaches, sore throat, sneezing, nasal congestion, post nasal drip, difficulty swallowing, tooth/dental problems, visual complaints, visual changes, ear aches. Neuro: difficulty with speech, weakness, numbness, ataxia. CV:  chest pain, orthopnea, PND, swelling in lower extremities, dizziness, palpitations, syncope.  Resp: cough, hemoptysis, dyspnea, wheezing. GI: heartburn, indigestion, abdominal pain, nausea, vomiting, diarrhea, constipation, change in bowel habits, loss of appetite, hematemesis, melena, hematochezia.  GU: dysuria, change in color of urine, urgency or frequency, flank pain, hematuria. MSK: joint pain or swelling, decreased range of motion. Psych: change in mood or affect, depression, anxiety, suicidal ideations, homicidal ideations. Skin: rash, itching, bruising.   Past Medical History:  She,  has a past medical history of Asthma, Bilateral ovarian cysts, and History of ovarian cyst.   Surgical History:   Past Surgical History:  Procedure Laterality Date   CESAREAN SECTION     OVARIAN CYST REMOVAL  2015   and 2013     Social History:   reports that she has never smoked. She has never used smokeless tobacco. She reports that she does not drink alcohol and does not use drugs.   Family History:  Her family history is not on file.   Allergies No Known Allergies   Home Medications  Prior to Admission medications    Medication Sig Start Date End Date Taking? Authorizing Provider  aspirin-acetaminophen-caffeine (EXCEDRIN MIGRAINE) [phone] MG tablet Take 2 tablets by mouth every 6 (six) hours as needed for headache. Patient not taking: No sig reported    Emergency, Nurse, RN  Blood Pressure Monitoring (BLOOD PRESSURE KIT) DEVI 1 kit by Does not apply route once a week. Patient not taking: No sig reported 09/12/20   Shelly Bombard, MD  ondansetron (ZOFRAN-ODT) 4 MG disintegrating tablet Take 1 tablet (4 mg total) by mouth every 8 (eight) hours as needed for nausea or vomiting. Patient not taking: No sig reported 07/10/20   Vanessa Kick, MD  Prenat-FeAsp-Meth-FA-DHA w/o A (PRENATE PIXIE) 10-0.6-0.4-200 MG CAPS Take 1 capsule by mouth daily. Patient not taking: No sig reported 09/08/20   Sloan Leiter, MD  terconazole (TERAZOL 7) 0.4 % vaginal cream Place 1 applicator vaginally at bedtime. Patient not taking: No sig reported 09/13/20   Shelly Bombard, MD     Critical care time: 30 min.    Montey Hora, [facility] For pager details, please see AMION or use Epic chat  After 1900, please call [facility] for cross coverage needs 02/05/2021, 10:26 PM

## 2021-02-05 NOTE — MAU Provider Note (Signed)
Event Date/Time   First Provider Initiated Contact with Patient 02/05/21 1720       S: Ms. Debra Bell is a 30 y.o. [phone] at [redacted]w[redacted]d  who presents to MAU today complaining of leaking of fluid since today but was found to have vaginal bleeding. She reports painful contractions every few minutes. Good fetal movement.  RN saw significant amount of bright red blood on pad.   O: BP 117/79   Pulse (!) 113   Temp 98 F (36.7 C) (Oral)   Resp 16   LMP 05/08/2020 (Approximate)   SpO2 98%  GENERAL: Well-developed, well-nourished female in no acute distress.  HEAD: Normocephalic, atraumatic.  CHEST: Normal effort of breathing, regular heart rate ABDOMEN: Soft, nontender, gravid PELVIC: No pooling of fluid. ~2 tablespoons of blood in vagina and coming from os. Cervix not friable.   Cervical exam:  Dilation: 2 Effacement (%): 50 Exam by:: n druebbisch rn   Fetal Monitoring: Baseline: 145 Variability: moderate Accelerations: 10x10 Decelerations: none Contractions: Q1-3 minutes  Results for orders placed or performed during the hospital encounter of 02/05/21 (from the past 24 hour(s))  Type and screen     Status: None (Preliminary result)   Collection Time: 02/05/21  4:59 PM  Result Value Ref Range   ABO/RH(D) PENDING    Antibody Screen PENDING    Sample Expiration      02/08/2021,2359 Performed at [facility], [address] [place], [place] [address]   CBC     Status: Abnormal   Collection Time: 02/05/21  4:59 PM  Result Value Ref Range   WBC 7.5 4.0 - 10.5 K/uL   RBC 4.30 3.87 - 5.11 MIL/uL   Hemoglobin 10.9 (L) 12.0 - 15.0 g/dL   HCT 34.2 (L) 87.6 - 81.1 %   MCV 81.6 80.0 - 100.0 fL   MCH 25.3 (L) 26.0 - 34.0 pg   MCHC 31.1 30.0 - 36.0 g/dL   RDW 57.2 (H) 62.0 - 35.5 %   Platelets 195 150 - 400 K/uL   nRBC 0.0 0.0 - 0.2 %     A: Vaginal bleeding in third trimester pregnancy  P: Attending called to come evaluate patient  [name], NP 02/05/2021  5:49 PM

## 2021-02-05 NOTE — Transfer of Care (Signed)
Immediate Anesthesia Transfer of Care Note  Patient: Debra Bell  Procedure(s) Performed: CESAREAN SECTION  Patient Location: PACU  Anesthesia Type:Spinal  Level of Consciousness: awake, alert  and oriented  Airway & Oxygen Therapy: Patient Spontanous Breathing  Post-op Assessment: Report given to RN and Post -op Vital signs reviewed and stable  Post vital signs: Reviewed and stable  Last Vitals:  Vitals Value Taken Time  BP 107/67 02/05/21 2126  Temp    Pulse 101 02/05/21 2130  Resp 28 02/05/21 2130  SpO2 97 % 02/05/21 2130  Vitals shown include unvalidated device data.  Last Pain:  Vitals:   02/05/21 2115  TempSrc:   PainSc: 0-No pain         Complications: No notable events documented.

## 2021-02-05 NOTE — MAU Note (Signed)
Debra Bell is a 30 y.o. at [redacted]w[redacted]d here in MAU reporting: started contracting around 0400, now they are back to back. Had some bleeding while in the bathtub. No LOF. +FM  Onset of complaint: today  Pain score: 9/10  Vitals:   02/05/21 1629  BP: 117/75  Pulse: (!) 105  Resp: 16  Temp: 98 F (36.7 C)  SpO2: 98%     FHT: EFM applied in room  Lab orders placed from triage: none

## 2021-02-05 NOTE — Anesthesia Procedure Notes (Signed)
Spinal  Patient location during procedure: OR Start time: 02/05/2021 7:12 PM End time: 02/05/2021 7:18 PM Reason for block: surgical anesthesia Staffing Performed: anesthesiologist  Anesthesiologist: Leilani Able, MD Preanesthetic Checklist Completed: patient identified, IV checked, site marked, risks and benefits discussed, surgical consent, monitors and equipment checked, pre-op evaluation and timeout performed Spinal Block Patient position: sitting Prep: DuraPrep and site prepped and draped Patient monitoring: continuous pulse ox and blood pressure Approach: midline Location: L3-4 Injection technique: single-shot Needle Needle type: Pencan  Needle gauge: 24 G Needle length: 10 cm Needle insertion depth: 9 cm Assessment Sensory level: T4 Events: CSF return

## 2021-02-05 NOTE — Progress Notes (Signed)
Lab Addendum  DIC Panel ONCE - STAT     Status: Abnormal (Preliminary result)   Collection Time: 02/05/21  9:35 PM  Result Value Ref Range   Prothrombin Time 17.6 (H) 11.4 - 15.2 seconds   INR 1.5 (H) 0.8 - 1.2   aPTT 41 (H) 24 - 36 seconds   Fibrinogen 171 (L) 210 - 475 mg/dL   D-Dimer, Quant >25.42 (H) 0.00 - 0.50 ug/mL-FEU   Platelets 120 (L) 150 - 400 K/uL   Smear Review PENDING   CBC     Status: Abnormal   Collection Time: 02/05/21  9:35 PM  Result Value Ref Range   WBC 14.4 (H) 4.0 - 10.5 K/uL   RBC 4.31 3.87 - 5.11 MIL/uL   Hemoglobin 11.7 (L) 12.0 - 15.0 g/dL   HCT 70.6 23.7 - 62.8 %   MCV 83.8 80.0 - 100.0 fL   MCH 27.1 26.0 - 34.0 pg   MCHC 32.4 30.0 - 36.0 g/dL   RDW 31.5 (H) 17.6 - 16.0 %   Platelets 120 (L) 150 - 400 K/uL   nRBC 0.0 0.0 - 0.2 %   Patient Vitals for the past 24 hrs:  BP Temp Temp src Pulse Resp SpO2  02/05/21 2214 -- -- -- 95 (!) 22 96 %  02/05/21 2213 -- -- -- 95 (!) 26 95 %  02/05/21 2212 106/77 -- -- 98 (!) 21 96 %  02/05/21 2211 -- -- -- 99 (!) 25 96 %  02/05/21 2210 -- -- -- 95 (!) 22 97 %  02/05/21 2209 -- -- -- 98 (!) 26 97 %  02/05/21 2200 97/84 98.2 F (36.8 C) -- 94 20 99 %  02/05/21 2153 -- -- -- 96 (!) 24 99 %  02/05/21 2152 -- -- -- (!) 102 20 98 %  02/05/21 2145 108/70 97.7 F (36.5 C) -- (!) 101 20 99 %  02/05/21 2130 (!) 122/95 97.8 F (36.6 C) -- (!) 103 (!) 21 97 %  02/05/21 2115 (!) 162/150 -- -- 95 (!) 26 100 %  02/05/21 2100 -- -- -- -- (!) 23 100 %  02/05/21 1636 117/79 -- -- (!) 113 -- 98 %  02/05/21 1629 117/75 98 F (36.7 C) Oral (!) 105 16 98 %    Patient is stable in PACU.  Given low fibrinogen level (consistent with the DIC), will transfuse with cryoprecipitate. Still awaiting CCM consultation and ICU bed placement. JP output about 50 ml. Will continue close monitoring.   Jaynie Collins, MD, FACOG Obstetrician & Gynecologist, [facility] for [employer], [facility]

## 2021-02-05 NOTE — Anesthesia Preprocedure Evaluation (Addendum)
Anesthesia Evaluation  Patient identified by MRN, date of birth, ID band Patient awake    Reviewed: Allergy & Precautions, H&P , NPO status , Patient's Chart, lab work & pertinent test results  Airway Mallampati: II       Dental   Pulmonary asthma ,    Pulmonary exam normal        Cardiovascular negative cardio ROS Normal cardiovascular exam     Neuro/Psych negative neurological ROS  negative psych ROS   GI/Hepatic negative GI ROS, Neg liver ROS,   Endo/Other  Morbid obesity  Renal/GU negative Renal ROS  negative genitourinary   Musculoskeletal negative musculoskeletal ROS (+)   Abdominal (+) + obese,   Peds  Hematology  (+) Blood dyscrasia, anemia ,   Anesthesia Other Findings   Reproductive/Obstetrics (+) Pregnancy                            Anesthesia Physical Anesthesia Plan  ASA: 3  Anesthesia Plan: Spinal   Post-op Pain Management:    Induction:   PONV Risk Score and Plan: Ondansetron, Dexamethasone and Scopolamine patch - Pre-op  Airway Management Planned: Natural Airway, Nasal Cannula and Simple Face Mask  Additional Equipment: None  Intra-op Plan:   Post-operative Plan:   Informed Consent: I have reviewed the patients History and Physical, chart, labs and discussed the procedure including the risks, benefits and alternatives for the proposed anesthesia with the patient or authorized representative who has indicated his/her understanding and acceptance.       Plan Discussed with: CRNA  Anesthesia Plan Comments:        Anesthesia Quick Evaluation

## 2021-02-05 NOTE — H&P (Signed)
Obstetric Preoperative History and Physical  Debra Bell is a 30 y.o. [phone] with IUP at [redacted]w[redacted]d and history of two previous cesarean sections presented to MAU with bleeding. Think she broke her bag of water. Bloody fluid noted.  Has frequent 2-3 minute contractions, started around 0400.  Reports good fetal movement.  No acute preoperative concerns.    Prenatal Course Source of Care: [facility] Pregnancy complications or risks: Patient Active Problem List   Diagnosis Date Noted   History of 2 cesarean sections 02/05/2021   Iron deficiency anemia 12/29/2020   Previous cesarean delivery affecting pregnancy, antepartum 09/21/2020   Supervision of other normal pregnancy, antepartum 09/08/2020    Nursing Staff Provider  Office Location  [facility] Dating   LMP  Language   English  Anatomy US   09/2020  Flu Vaccine  Given 09/12/20 Genetic Screen  NIPS: LR  AFP: negative  First Screen:  Quad:    TDaP Vaccine   given 12/27/2020 Hgb A1C or  GTT Early  Third trimester 83/105/70  COVID Vaccine Not vaccinated   LAB RESULTS   Rhogam   Blood Type B/Positive/-- (03/08 1450)   Feeding Plan Bottle Antibody Negative (03/08 1450)  Contraception BTL vs IUD Rubella 1.51 (03/08 1450)  Circumcision Yes if a boy RPR Non Reactive (06/22 1116)   Pediatrician  [facility] HBsAg Negative (03/08 1450)   Support Person FOB-George HCVAb Negative (03/08 1450)  Prenatal Classes  HIV Non Reactive (06/22 1116)     BTL Consent  signed 12/27/2020 GBS Positive/-- (07/21 0246)   VBAC Consent  Pap  normal 09/2020    Hgb Electro    BP Cuff Ordered 09/12/20 CF   PHQ-9/GAD-7  [* ] 32 weeks SMA     Waterbirth  $RemoveBef'[ ]'zULKQUdPXA$  Class $Remo'[ ]'gyKFq$  Consent $Remove'[ ]'iHJTgtv$  CNM visit    Induction  $RemoveBe'[ ]'FuMqZEtlC$  Orders Entered $RemoveBefore'[ ]'ylDYsSGDbwADw$ Foley Y/N   She plans to bottle feed She desires IUD for postpartum contraception.   Past Medical History:  Diagnosis Date   Asthma    Bilateral ovarian cysts    History of ovarian cyst     Past Surgical History:  Procedure Laterality Date    CESAREAN SECTION     OVARIAN CYST REMOVAL  2015   and 2013    OB History  Gravida Para Term Preterm AB Living  $Remov'4 2 2   1 2  'hezUap$ SAB IAB Ectopic Multiple Live Births    1     2    # Outcome Date GA Lbr Len/2nd Weight Sex Delivery Anes PTL Lv  4 Current           3 IAB           2 Term     M CS-LTranv   LIV  1 Term     M CS-LTranv   LIV    Social History   Socioeconomic History   Marital status: Single    Spouse name: Not on file   Number of children: Not on file   Years of education: Not on file   Highest education level: Not on file  Occupational History   Not on file  Tobacco Use   Smoking status: Never   Smokeless tobacco: Never  Vaping Use   Vaping Use: Never used  Substance and Sexual Activity   Alcohol use: Never   Drug use: Never   Sexual activity: Not Currently    Birth control/protection: None  Other Topics Concern   Not on  file  Social History Narrative   Not on file   Social Determinants of Health   Financial Resource Strain: Not on file  Food Insecurity: Not on file  Transportation Needs: Not on file  Physical Activity: Not on file  Stress: Not on file  Social Connections: Not on file    History reviewed. No pertinent family history.  Medications Prior to Admission  Medication Sig Dispense Refill Last Dose   aspirin-acetaminophen-caffeine (EXCEDRIN MIGRAINE) 250-250-65 MG tablet Take 2 tablets by mouth every 6 (six) hours as needed for headache. (Patient not taking: No sig reported)   More than a month   Blood Pressure Monitoring (BLOOD PRESSURE KIT) DEVI 1 kit by Does not apply route once a week. (Patient not taking: No sig reported) 1 each 0 More than a month   ondansetron (ZOFRAN-ODT) 4 MG disintegrating tablet Take 1 tablet (4 mg total) by mouth every 8 (eight) hours as needed for nausea or vomiting. (Patient not taking: No sig reported) 15 tablet 0 More than a month   Prenat-FeAsp-Meth-FA-DHA w/o A (PRENATE PIXIE) 10-0.6-0.4-200 MG CAPS Take 1  capsule by mouth daily. (Patient not taking: No sig reported) 30 capsule 11 More than a month   terconazole (TERAZOL 7) 0.4 % vaginal cream Place 1 applicator vaginally at bedtime. (Patient not taking: No sig reported) 45 g 0 More than a month    No Known Allergies  Review of Systems: Pertinent items noted in HPI and remainder of comprehensive ROS otherwise negative.  Physical Exam: BP 117/79   Pulse (!) 113   Temp 98 F (36.7 C) (Oral)   Resp 16   LMP 05/08/2020 (Approximate)   SpO2 98%  FHR: 140 bpm + mod var + accels, no decels CONSTITUTIONAL: Well-developed, well-nourished female in no acute distress.  NECK: Normal range of motion, supple, no masses SKIN: Skin is warm and dry. No rash noted. Not diaphoretic. No erythema. No pallor. NEUROLOGIC: Alert and oriented to person, place, and time. Normal reflexes, muscle tone coordination. No cranial nerve deficit noted. PSYCHIATRIC: Normal mood and affect. Normal behavior. Normal judgment and thought content. CARDIOVASCULAR: Normal heart rate noted, regular rhythm RESPIRATORY: Effort and breath sounds normal, no problems with respiration noted ABDOMEN: Soft, nontender, nondistended, gravid. Well-healed Pfannenstiel incision. PELVIC: Dilation: 2 Effacement (%): 50 Exam by:: n druebbisch rn MUSCULOSKELETAL: Normal range of motion. No edema and no tenderness. 2+ distal pulses.  Pertinent Labs/Studies:   Results for orders placed or performed during the hospital encounter of 02/05/21 (from the past 72 hour(s))  Type and screen     Status: None (Preliminary result)   Collection Time: 02/05/21  4:59 PM  Result Value Ref Range   ABO/RH(D) PENDING    Antibody Screen PENDING    Sample Expiration      02/08/2021,2359 Performed at [facility], [place] [address], [place], [place] [address]   CBC     Status: Abnormal   Collection Time: 02/05/21  4:59 PM  Result Value Ref Range   WBC 7.5 4.0 - 10.5 K/uL   RBC 4.30 3.87 - 5.11 MIL/uL    Hemoglobin 10.9 (L) 12.0 - 15.0 g/dL   HCT 35.1 (L) 36.0 - 46.0 %   MCV 81.6 80.0 - 100.0 fL   MCH 25.3 (L) 26.0 - 34.0 pg   MCHC 31.1 30.0 - 36.0 g/dL   RDW 18.8 (H) 11.5 - 15.5 %   Platelets 195 150 - 400 K/uL   nRBC 0.0 0.0 - 0.2 %  Comment: Performed at [facility], [place] [address] [place], [place] [address]    Assessment and Plan: Debra Bell is a 30 y.o. [phone] at [redacted]w[redacted]d being admitted for repeat cesarean section and IUD placement given labor and bleeding.  The risks of surgery were discussed with the patient including but were not limited to: bleeding which may require transfusion or reoperation; infection which may require antibiotics; injury to bowel, bladder, ureters or other surrounding organs; injury to the fetus; need for additional procedures including hysterectomy in the event of a life-threatening hemorrhage; formation of adhesions; placental abnormalities wth subsequent pregnancies; incisional problems; thromboembolic phenomenon and other postoperative/anesthesia complications.   For the postplacental IUD placement, discussed risks of postpartum expulsion, irregular bleeding, cramping, infection, malpositioning or misplacement of the IUD which may require further procedures. Also discussed >99% contraception efficacy, increased risk of ectopic pregnancy with failure of method.   The patient concurred with the proposed plan, giving informed written consent for the procedures. Patient has been NPO since last night she will remain NPO for procedure. Anesthesia and OR aware. Preoperative prophylactic antibiotics and SCDs ordered on call to the OR. To OR when ready.    Verita Schneiders, MD, [place] for [employer], [place]

## 2021-02-06 ENCOUNTER — Encounter (HOSPITAL_COMMUNITY): Payer: Self-pay | Admitting: Obstetrics & Gynecology

## 2021-02-06 DIAGNOSIS — O45029 Premature separation of placenta with disseminated intravascular coagulation, unspecified trimester: Secondary | ICD-10-CM | POA: Diagnosis not present

## 2021-02-06 LAB — DIC (DISSEMINATED INTRAVASCULAR COAGULATION)PANEL
D-Dimer, Quant: >20 ug/mL-FEU — ABNORMAL HIGH (ref 0.00–0.50)
D-Dimer, Quant: >20 ug/mL-FEU — ABNORMAL HIGH (ref 0.00–0.50)
Fibrinogen: 156 mg/dL — ABNORMAL LOW (ref 210–475)
Fibrinogen: 325 mg/dL (ref 210–475)
INR: 1.4 — ABNORMAL HIGH (ref 0.8–1.2)
INR: 1.2 (ref 0.8–1.2)
Platelets: 109 10*3/uL — ABNORMAL LOW (ref 150–400)
Platelets: 113 10*3/uL — ABNORMAL LOW (ref 150–400)
Prothrombin Time: 17.4 seconds — ABNORMAL HIGH (ref 11.4–15.2)
Prothrombin Time: 15.5 seconds — ABNORMAL HIGH (ref 11.4–15.2)
Smear Review: NONE SEEN
Smear Review: NONE SEEN
aPTT: 34 seconds (ref 24–36)
aPTT: 31 seconds (ref 24–36)

## 2021-02-06 LAB — PREPARE FRESH FROZEN PLASMA: Unit division: 0

## 2021-02-06 LAB — BPAM FFP
Blood Product Expiration Date: 202208042359
Blood Product Expiration Date: 202208042359
Blood Product Expiration Date: 202208042359
Blood Product Expiration Date: 202208042359
ISSUE DATE / TIME: 202208012019
ISSUE DATE / TIME: 202208012019
ISSUE DATE / TIME: 202208012019
ISSUE DATE / TIME: 202208012019
Unit Type and Rh: 1700
Unit Type and Rh: 7300
Unit Type and Rh: 7300
Unit Type and Rh: 7300

## 2021-02-06 LAB — COMPREHENSIVE METABOLIC PANEL
ALT: 10 U/L (ref 0–44)
AST: 24 U/L (ref 15–41)
Albumin: 2.3 g/dL — ABNORMAL LOW (ref 3.5–5.0)
Alkaline Phosphatase: 144 U/L — ABNORMAL HIGH (ref 38–126)
Anion gap: 9 (ref 5–15)
BUN: 5 mg/dL — ABNORMAL LOW (ref 6–20)
CO2: 19 mmol/L — ABNORMAL LOW (ref 22–32)
Calcium: 8.3 mg/dL — ABNORMAL LOW (ref 8.9–10.3)
Chloride: 105 mmol/L (ref 98–111)
Creatinine, Ser: 0.61 mg/dL (ref 0.44–1.00)
GFR, Estimated: >60 mL/min (ref >60)
Glucose, Bld: 136 mg/dL — ABNORMAL HIGH (ref 70–99)
Potassium: 4.1 mmol/L (ref 3.5–5.1)
Sodium: 133 mmol/L — ABNORMAL LOW (ref 135–145)
Total Bilirubin: 2.6 mg/dL — ABNORMAL HIGH (ref 0.3–1.2)
Total Protein: 5.3 g/dL — ABNORMAL LOW (ref 6.5–8.1)

## 2021-02-06 LAB — CBC
HCT: 31.7 % — ABNORMAL LOW (ref 36.0–46.0)
HCT: 29.3 % — ABNORMAL LOW (ref 36.0–46.0)
Hemoglobin: 10.3 g/dL — ABNORMAL LOW (ref 12.0–15.0)
Hemoglobin: 9.5 g/dL — ABNORMAL LOW (ref 12.0–15.0)
MCH: 26.9 pg (ref 26.0–34.0)
MCH: 26.8 pg (ref 26.0–34.0)
MCHC: 32.5 g/dL (ref 30.0–36.0)
MCHC: 32.4 g/dL (ref 30.0–36.0)
MCV: 82.8 fL (ref 80.0–100.0)
MCV: 82.5 fL (ref 80.0–100.0)
Platelets: 113 10*3/uL — ABNORMAL LOW (ref 150–400)
Platelets: 113 10*3/uL — ABNORMAL LOW (ref 150–400)
RBC: 3.83 MIL/uL — ABNORMAL LOW (ref 3.87–5.11)
RBC: 3.55 MIL/uL — ABNORMAL LOW (ref 3.87–5.11)
RDW: 17.4 % — ABNORMAL HIGH (ref 11.5–15.5)
RDW: 17.6 % — ABNORMAL HIGH (ref 11.5–15.5)
WBC: 15.9 10*3/uL — ABNORMAL HIGH (ref 4.0–10.5)
WBC: 15.9 10*3/uL — ABNORMAL HIGH (ref 4.0–10.5)
nRBC: 0 % (ref 0.0–0.2)
nRBC: 0 % (ref 0.0–0.2)

## 2021-02-06 LAB — BPAM PLATELET PHERESIS
Blood Product Expiration Date: 202208012359
ISSUE DATE / TIME: 202208012022
Unit Type and Rh: 8400

## 2021-02-06 LAB — PREPARE PLATELET PHERESIS: Unit division: 0

## 2021-02-06 LAB — BPAM CRYOPRECIPITATE
Blood Product Expiration Date: 202208020230
Blood Product Expiration Date: 202208020425
ISSUE DATE / TIME: 202208012052
Unit Type and Rh: 5100
Unit Type and Rh: 5100

## 2021-02-06 LAB — RPR: RPR Ser Ql: NONREACTIVE

## 2021-02-06 LAB — PREPARE CRYOPRECIPITATE
Unit division: 0
Unit division: 0

## 2021-02-06 LAB — BLOOD PRODUCT ORDER (VERBAL) VERIFICATION

## 2021-02-06 LAB — PHOSPHORUS: Phosphorus: 3.8 mg/dL (ref 2.5–4.6)

## 2021-02-06 LAB — MRSA NEXT GEN BY PCR, NASAL: MRSA by PCR Next Gen: NOT DETECTED

## 2021-02-06 LAB — MAGNESIUM: Magnesium: 1.4 mg/dL — ABNORMAL LOW (ref 1.7–2.4)

## 2021-02-06 MED ORDER — MAGNESIUM SULFATE 4 GM/100ML IV SOLN
4.0000 g | Freq: Once | INTRAVENOUS | Status: AC
  Administered 2021-02-06: 4 g via INTRAVENOUS
  Filled 2021-02-06: qty 100

## 2021-02-06 MED ORDER — SODIUM CHLORIDE 0.9% IV SOLUTION: Freq: Once | INTRAVENOUS | Status: AC

## 2021-02-06 NOTE — Progress Notes (Signed)
Patient complaints of itching, denies SOB, denies headache, denies back pain, no evidence of rash, temperature WNL. Lotion and benadryl provided.

## 2021-02-06 NOTE — Lactation Note (Signed)
Lactation Consultation Note  Patient Name: Debra Bell YJEHU'D Date: 02/06/2021   Age:30 y.o.  P4, Spoke with RN caring for mother and states she did not breastfeed her first two children but would like to breastfeed this child.  Baby is in nursery and has been formula feeding.  RN states mother is not feeling well at this time but will be possibly transferred to [facility] this afternoon and reunited with baby. LC went to consult with mother and mother was sleeping. Lactation will follow up later today.      Hardie Pulley  RN IBCLC 02/06/2021, 9:34 AM

## 2021-02-06 NOTE — Progress Notes (Signed)
Patient arrived to [facility] 21 from PACU. Vitals signs stable, room air, minimal vaginal bleeding. CHG bath given. Pad changed and fundus massaged. Patient notified significant other of new room location. Patient tolerating sips of ginger ale.

## 2021-02-06 NOTE — Progress Notes (Signed)
NAME:  Debra Bell, MRN:  [id number], DOB:  21-Oct-1990, LOS: 1 ADMISSION DATE:  02/05/2021, CONSULTATION DATE:  02/05/21 REFERRING MD:  Harolyn Rutherford CHIEF COMPLAINT:  Placental Abruption   History of Present Illness:  Debra Bell is a 30 y.o. female [phone] with IUP at [redacted]w[redacted]d who has two previous C-sections.  She presented to MADU 8/1 with vaginal bleeding and feelings of water breaking along with frequent 2-3 min contractions with good fetal movement.  [facility] and post delivery IUD placement.  Intraop, she was found to have placental abruption with hemorrhagic shock (3L of blood loss).  She was transfused 4u PRBC, 2 FFP, 1u platelets. Intraop she was on phenylephrine briefly, now stopped.  Post op while in PACU, fibrinogen low at 171 for which cryoprecipitate was ordered.  PCCM was called in consultation and it was requested that she be transferred to the ICU for overnight observation.   Pertinent  Medical History:  has Supervision of other normal pregnancy, antepartum; Previous cesarean delivery affecting pregnancy, antepartum; Iron deficiency anemia; S/P cesarean section; Postpartum hemorrhage of 3 L; Placental abruption with disseminated intravascular coagulation, delivered; D[place] ([place]seminated [place]ascular coagulation) [place] and [place].  Significant Hospital Events: Including procedures, antibiotic start and stop dates in addition to other pertinent events   8/1 > admit for C-section and post delivery IUD placement.  Complicated by placental abruption with hemorrhagic shock after 3L EBL.   Interim History / Subjective:   Comfortable. No pain. Ready to see baby   Objective:  Blood pressure 93/62, pulse 78, temperature 97.8 F (36.6 C), temperature source Axillary, resp. rate 19, height _0  (1.676 m), weight 127 kg, last menstrual period 05/08/2020, SpO2 98 %, unknown if currently breastfeeding.         Intake/Output Summary (Last 24 hours) at 02/06/2021 0830 Last data filed at 02/06/2021 0600 Gross per 24 hour  Intake 6382.56 ml  Output 3657 ml  Net 2725.56 ml   Filed Weights   02/06/21 0000  Weight: 127 kg    Examination: General: FM, obese, comfortable  Neuro: AAOX3 HEENT: NCAT tracking  Cardiovascular: RRR, no mrg  Lungs: BL breaths, no crackles  Abdomen: JP drain  Musculoskeletal: BL LE edema  Skin: intact   Labs/imaging personally reviewed:   Resolved Hospital Problem list:   Assessment & Plan:   Placental Abruption with hemorrhagic shock - s/p emergent C-section. -s/p product resuscitation  - repeat DIC panel pending  - BP stable  - post-op care per primary  - I agree likely stable for transfer from the ICU this afternoon   Acute blood loss anemia 2/2 above- s/p 4u PRBC, 2u FFP, 1u platelet transfusion. DIC - 2/2 above. - cryo and PRBC per primary team  Hx asthma. - BD prn    Best practice (evaluated daily):  Diet/type: NPO - advance per primary surgeon  DVT prophylaxis: SCD GI prophylaxis: N/A Lines: N/A Foley:  N/A Code Status:  full code Last date of multidisciplinary goals of care discussion: Per Primary.  Labs   CBC: Recent Labs  Lab 02/05/21 1659 02/05/21 2135 02/06/21 0303  WBC 7.5 14.4* 15.9*  HGB 10.9* 11.7* 10.3*  HCT 35.1* 36.1 31.7*  MCV 81.6 83.8 82.8  PLT 195 120*  120* 109*  113*    Basic Metabolic Panel: Recent Labs  Lab 02/06/21 0303  NA 133*  K 4.1  CL 105  CO2 19*  GLUCOSE 136*  BUN 5*  CREATININE 0.61  CALCIUM 8.3*  MG 1.4*  PHOS 3.8   GFR: Estimated Creatinine Clearance: 139 mL/min (by C-G formula based on SCr of 0.61 mg/dL). Recent Labs  Lab 02/05/21 1659 02/05/21 2135 02/06/21 0303  WBC 7.5 14.4* 15.9*    Liver Function Tests: Recent Labs  Lab 02/06/21 0303  AST 24  ALT 10  ALKPHOS 144*  BILITOT 2.6*  PROT 5.3*  ALBUMIN 2.3*   No results for input(s): LIPASE, AMYLASE in the last 168  hours. No results for input(s): AMMONIA in the last 168 hours.  ABG No results found for: PHART, PCO2ART, PO2ART, HCO3, TCO2, ACIDBASEDEF, O2SAT   Coagulation Profile: Recent Labs  Lab 02/05/21 1514 02/05/21 2135 02/06/21 0303  INR 1.1 1.5* 1.4*    Cardiac Enzymes: No results for input(s): CKTOTAL, CKMB, CKMBINDEX, TROPONINI in the last 168 hours.  HbA1C: No results found for: HGBA1C  CBG: Recent Labs  Lab 02/05/21 2343  GLUCAP 113*    Review of Systems:   All negative; except for those that are bolded, which indicate positives.  Constitutional: weight loss, weight gain, night sweats, fevers, chills, fatigue, weakness.  HEENT: headaches, sore throat, sneezing, nasal congestion, post nasal drip, difficulty swallowing, tooth/dental problems, visual complaints, visual changes, ear aches. Neuro: difficulty with speech, weakness, numbness, ataxia. CV:  chest pain, orthopnea, PND, swelling in lower extremities, dizziness, palpitations, syncope.  Resp: cough, hemoptysis, dyspnea, wheezing. GI: heartburn, indigestion, abdominal pain, nausea, vomiting, diarrhea, constipation, change in bowel habits, loss of appetite, hematemesis, melena, hematochezia.  GU: dysuria, change in color of urine, urgency or frequency, flank pain, hematuria. MSK: joint pain or swelling, decreased range of motion. Psych: change in mood or affect, depression, anxiety, suicidal ideations, homicidal ideations. Skin: rash, itching, bruising.   Past Medical History:  She,  has a past medical history of Asthma, Bilateral ovarian cysts, and History of ovarian cyst.   Surgical History:   Past Surgical History:  Procedure Laterality Date   CESAREAN SECTION     CESAREAN SECTION N/A 02/05/2021   Procedure: CESAREAN SECTION;  Surgeon: Osborne Oman, MD;  Location: [facility] LD ORS;  Service: Obstetrics;  Laterality: N/A;   OVARIAN CYST REMOVAL  2015   and 2013     Social History:   reports that she has never  smoked. She has never used smokeless tobacco. She reports that she does not drink alcohol and does not use drugs.   Family History:  Her family history is not on file.   Allergies No Known Allergies   Home Medications  Prior to Admission medications   Medication Sig Start Date End Date Taking? Authorizing Provider  aspirin-acetaminophen-caffeine (EXCEDRIN MIGRAINE) [phone] MG tablet Take 2 tablets by mouth every 6 (six) hours as needed for headache. Patient not taking: No sig reported    Emergency, Nurse, RN  Blood Pressure Monitoring (BLOOD PRESSURE KIT) DEVI 1 kit by Does not apply route once a week. Patient not taking: No sig reported 09/12/20   Shelly Bombard, MD  ondansetron (ZOFRAN-ODT) 4 MG disintegrating tablet Take 1 tablet (4 mg total) by mouth every 8 (eight) hours as needed for nausea or vomiting. Patient not taking: No sig reported 07/10/20   Vanessa Kick, MD  Prenat-FeAsp-Meth-FA-DHA w/o A (PRENATE PIXIE) 10-0.6-0.4-200 MG CAPS Take 1 capsule by mouth daily. Patient not taking: No sig reported 09/08/20   Sloan Leiter, MD  terconazole (TERAZOL 7) 0.4 % vaginal cream Place 1 applicator vaginally  at bedtime. Patient not taking: No sig reported 09/13/20   Shelly Bombard, MD     Garner Nash, DO [facility] 02/06/2021 8:31 AM

## 2021-02-06 NOTE — Lactation Note (Signed)
Lactation Consultation Note  Patient Name: Tanasia Budzinski RAQTM'[id number] Date: 02/06/2021 Reason for consult: Initial assessment Age:30 y.o.  P68, Visited with mother and brought baby.  Mother would like to try breastfeeding. Reviewed hand expression and attempted. Baby asleep, did not latch.  PLaced. baby STS on mother's chest. Mom made aware of O/P services, breastfeeding support groups, community resources, and our phone # for post-discharge questions.    Maternal Data Has patient been taught Hand Expression?: Yes Does the patient have breastfeeding experience prior to this delivery?: No  Feeding Mother's Current Feeding Choice: Breast Milk and Formula   Lactation Tools Discussed/Used  RN to order hand pump until mother is at [facility] later today.  Interventions Interventions: Breast feeding basics reviewed;Assisted with latch;Education;Hand express  Discharge    Consult Status Consult Status: Follow-up Date: 02/07/21 Follow-up type: In-patient    Dahlia Byes [facility] 02/06/2021, 2:19 PM

## 2021-02-06 NOTE — Progress Notes (Signed)
Lactation RN able to bring baby to mother's bedside to attempt breastfeeding. Unable to feed, lactation RN taught pt how to hand express. Skin to skin with baby for approx. 20 minutes. Baby's father able to visit and hold baby at mother's bedside.   Mother's VS stable, awaiting transfer orders to mother/baby unit. Attempting to order hand pump to begin breast pumping with mother.   Shali Vesey RN

## 2021-02-06 NOTE — Progress Notes (Signed)
Patient transferred to [facility] specialty care via wheelchair with RN and NT. Pt voided in toilet, clear/amber, before transfer. Peri care completed, fresh mesh undies and postpartum pad placed. JP drain emptied, 10cc serosang output. SBA ambulation. ICU RN notified OB RN of incomplete Tdap and MMR vaccines; OB RN to complete charting based on OB clinic history.   Pt had all belongings and partner at bedside at time of transfer.

## 2021-02-06 NOTE — Lactation Note (Addendum)
This note was copied from a baby's chart. Lactation Consultation Note  Patient Name: Debra Bell [id number] Date: 02/06/2021 Reason for consult: Follow-up assessment;Mother's request;Early term 37-38.6wks Age:31 hours  LC talked with RN, Lovett Sox for earlier in the day. Mom to call for latch assistance. Mom did not call. LC came to see if Mother was ready to put infant to breast. Mother states in pain like to try in the morning.   LC reviewed with mother different latch positions in lactation support guide and the importance of pumping to maintain her milk supply. LC encouraged mother to pump using dEBP q 3hrs for 15 min. Mom stated flanged size accessed earlier in the day. Mom stated she did not want to latch or pump at the time of the visit.   Maternal Data Has patient been taught Hand Expression?: Yes  Feeding Mother's Current Feeding Choice: Breast Milk and Formula  LATCH Score                    Lactation Tools Discussed/Used Tools: Pump Breast pump type: Double-Electric Breast Pump Pump Education: Setup, frequency, and cleaning;Milk Storage Reason for Pumping: increase stimulation Pumping frequency: every 3 hrs for 15 min  Interventions Interventions: Breast feeding basics reviewed;Breast compression;Skin to skin;Position options;Hand express;Expressed milk;Education;DEBP  Discharge    Consult Status Consult Status: Follow-up Date: 02/07/21 Follow-up type: In-patient    Debra Bell  [name] 02/06/2021, 10:40 PM

## 2021-02-06 NOTE — Progress Notes (Signed)
Postpartum Day 1: Cesarean Delivery complicated by Placental Abruption, DIC ~3L, DIC requiring MTP protocol activation. Admitted to ICU after procedure.  Subjective: Patient reports incisional pain, rates it as mild. No other symptoms. Wants to see baby.  Objective: Vital signs in last 24 hours: Temp:  [97.7 F (36.5 C)-98.4 F (36.9 C)] 98.1 F (36.7 C) (08/02 0400) Pulse Rate:  [77-113] 82 (08/02 0400) Resp:  [11-31] 17 (08/02 0400) BP: (97-162)/(70-150) 106/71 (08/02 0400) SpO2:  [95 %-100 %] 99 % (08/02 0400) Weight:  [202 kg] 127 kg (08/02 0000)   .08/01 0701 - 08/02 0700 In: 5427 [P.O.:440; I.V.:2949.6; CWCBJ:6283; IV Piggyback:752.4] Out: 3412 [Urine:825; Drains:135; Blood:2452]    Physical Exam:  General: alert and no distress Lochia: appropriate Uterine Fundus: firm, abdomen soft Incision: dressing in place, bloody drainage noted.  JP in place. DVT Evaluation: No evidence of DVT seen on physical exam. No significant calf/ankle edema.  Results for orders placed or performed during the hospital encounter of 02/05/21 (from the past 24 hour(s))  Fibrinogen     Status: None   Collection Time: 02/05/21  3:14 PM  Result Value Ref Range   Fibrinogen 446 210 - 475 mg/dL  APTT     Status: None   Collection Time: 02/05/21  3:14 PM  Result Value Ref Range   aPTT 30 24 - 36 seconds  Protime-INR     Status: None   Collection Time: 02/05/21  3:14 PM  Result Value Ref Range   Prothrombin Time 14.1 11.4 - 15.2 seconds   INR 1.1 0.8 - 1.2  Type and screen     Status: None (Preliminary result)   Collection Time: 02/05/21  4:59 PM  Result Value Ref Range   ABO/RH(D) B POS    Antibody Screen NEG    Sample Expiration 02/08/2021,2359    Unit Number [id number]    Blood Component Type RED CELLS,LR    Unit division 00    Status of Unit ISSUED    Transfusion Status OK TO TRANSFUSE    Crossmatch Result Compatible    Unit Number [id number]    Blood Component Type RED  CELLS,LR    Unit division 00    Status of Unit ISSUED    Transfusion Status OK TO TRANSFUSE    Crossmatch Result Compatible    Unit Number [id number]    Blood Component Type RED CELLS,LR    Unit division 00    Status of Unit ISSUED    Transfusion Status OK TO TRANSFUSE    Crossmatch Result Compatible    Unit Number [id number]    Blood Component Type RED CELLS,LR    Unit division 00    Status of Unit ISSUED    Transfusion Status OK TO TRANSFUSE    Crossmatch Result Compatible    Unit Number [id number]    Blood Component Type RED CELLS,LR    Unit division 00    Status of Unit REL FROM [facility]    Transfusion Status OK TO TRANSFUSE    Crossmatch Result      Compatible Performed at [facility], [address], [place], [place] [address]    Unit Number [id number]    Blood Component Type RED CELLS,LR    Unit division 00    Status of Unit REL FROM [facility]    Transfusion Status OK TO TRANSFUSE    Crossmatch Result Compatible    Unit Number [id number]    Blood Component Type RED CELLS,LR    Unit  division 00    [facility]    Transfusion Status OK TO TRANSFUSE    Crossmatch Result Compatible    Unit Number [id number]    Blood Component Type RED CELLS,LR    Unit division 00    [facility]    Transfusion Status OK TO TRANSFUSE    Crossmatch Result Compatible    Unit Number [id number]    Blood Component Type RED CELLS,LR    Unit division 00    Status of Unit ALLOCATED    Transfusion Status OK TO TRANSFUSE    Crossmatch Result Compatible    Unit Number [id number]    Blood Component Type RED CELLS,LR    Unit division 00    Status of Unit ALLOCATED    Transfusion Status OK TO TRANSFUSE    Crossmatch Result Compatible   CBC     Status: Abnormal   Collection Time: 02/05/21  4:59 PM  Result Value Ref Range   WBC 7.5 4.0 - 10.5 K/uL   RBC 4.30 3.87 - 5.11 MIL/uL   Hemoglobin 10.9 (L) 12.0 - 15.0 g/dL   HCT 64.435.1  (L) 03.436.0 - 46.0 %   MCV 81.6 80.0 - 100.0 fL   MCH 25.3 (L) 26.0 - 34.0 pg   MCHC 31.1 30.0 - 36.0 g/dL   RDW 74.218.8 (H) 59.511.5 - 63.815.5 %   Platelets 195 150 - 400 K/uL   nRBC 0.0 0.0 - 0.2 %  Resp Panel by RT-PCR (Flu A&B, Covid) Nasopharyngeal Swab     Status: None   Collection Time: 02/05/21  5:19 PM   Specimen: Nasopharyngeal Swab; Nasopharyngeal(NP) swabs in vial transport medium  Result Value Ref Range   SARS Coronavirus 2 by RT PCR NEGATIVE NEGATIVE   Influenza A by PCR NEGATIVE NEGATIVE   Influenza B by PCR NEGATIVE NEGATIVE  Prepare RBC (crossmatch)     Status: None   Collection Time: 02/05/21  5:27 PM  Result Value Ref Range   Order Confirmation      ORDERS RECEIVED TO CROSSMATCH Performed at [facility], [address] [place], [place] [address]   ABO/Rh     Status: None   Collection Time: 02/05/21  6:16 PM  Result Value Ref Range   ABO/RH(D)      B POS Performed at [facility], [address] [place], [place] [address]   Prepare RBC (crossmatch)     Status: None   Collection Time: 02/05/21  8:01 PM  Result Value Ref Range   Order Confirmation      ORDER PROCESSED BY BLOOD BANK Performed at [facility], [address] [place], [place] [address]   Prepare fresh frozen plasma     Status: None (Preliminary result)   Collection Time: 02/05/21  8:12 PM  Result Value Ref Range   Unit Number [id number]    Blood Component Type THW PLS APHR    Unit division A0    Status [facility]    Transfusion Status OK TO TRANSFUSE    Unit Number [id number]    Blood Component Type THW PLS APHR    Unit division A0    Status of Unit ISSUED    Transfusion Status OK TO TRANSFUSE    Unit Number [id number]    Blood Component Type THW PLS APHR    Unit division A0    Status of Unit [facility]  Transfusion Status      OK TO TRANSFUSE Performed at [facility], [address], [place], [place] [address]    Unit Number [id number]     Blood Component Type THW PLS APHR    Unit division 00    Status of Unit ISSUED    Transfusion Status OK TO TRANSFUSE   Prepare platelet pheresis     Status: None (Preliminary result)   Collection Time: 02/05/21  8:20 PM  Result Value Ref Range   Unit Number [id number]    Blood Component Type PSORALEN TREATED    Unit division 00    Status of Unit ISSUED    Transfusion Status      OK TO TRANSFUSE Performed at [facility], [address] [place], [place] [address]   Prepare cryoprecipitate     Status: None (Preliminary result)   Collection Time: 02/05/21  8:20 PM  Result Value Ref Range   Unit Number [id number]    Blood Component Type CRYPOOL THAW    Unit division 00    Status of Unit ISSUED    Transfusion Status      OK TO TRANSFUSE Performed at [facility], [address] [place], [place] [address]   DIC Panel ONCE - STAT     Status: Abnormal   Collection Time: 02/05/21  9:35 PM  Result Value Ref Range   Prothrombin Time 17.6 (H) 11.4 - 15.2 seconds   INR 1.5 (H) 0.8 - 1.2   aPTT 41 (H) 24 - 36 seconds   Fibrinogen 171 (L) 210 - 475 mg/dL   D-Dimer, Quant >76.54 (H) 0.00 - 0.50 ug/mL-FEU   Platelets 120 (L) 150 - 400 K/uL   Smear Review NO SCHISTOCYTES SEEN   CBC     Status: Abnormal   Collection Time: 02/05/21  9:35 PM  Result Value Ref Range   WBC 14.4 (H) 4.0 - 10.5 K/uL   RBC 4.31 3.87 - 5.11 MIL/uL   Hemoglobin 11.7 (L) 12.0 - 15.0 g/dL   HCT 65.0 35.4 - 65.6 %   MCV 83.8 80.0 - 100.0 fL   MCH 27.1 26.0 - 34.0 pg   MCHC 32.4 30.0 - 36.0 g/dL   RDW 81.2 (H) 75.1 - 70.0 %   Platelets 120 (L) 150 - 400 K/uL   nRBC 0.0 0.0 - 0.2 %  Prepare cryoprecipitate     Status: None (Preliminary result)   Collection Time: 02/05/21 10:22 PM  Result Value Ref Range   Unit Number [id number]    Blood Component Type CRYPOOL THAW    Unit division 00    Status of Unit ALLOCATED    Transfusion Status      OK TO TRANSFUSE Performed at [facility], [address], [place], [place] [address]   MRSA Next Gen by PCR, Nasal     Status: None   Collection Time: 02/05/21 11:00 PM   Specimen: Nasal Mucosa; Nasal Swab  Result Value Ref Range   MRSA by PCR Next Gen NOT DETECTED NOT DETECTED  Glucose, capillary     Status: Abnormal   Collection Time: 02/05/21 11:43 PM  Result Value Ref Range   Glucose-Capillary 113 (H) 70 - 99 mg/dL  Comprehensive metabolic panel     Status: Abnormal   Collection Time: 02/06/21  3:03 AM  Result Value Ref Range   Sodium 133 (L) 135 - 145 mmol/L   Potassium 4.1 3.5 - 5.1 mmol/L  Chloride 105 98 - 111 mmol/L   CO2 19 (L) 22 - 32 mmol/L   Glucose, Bld 136 (H) 70 - 99 mg/dL   BUN 5 (L) 6 - 20 mg/dL   Creatinine, Ser 6.96 0.44 - 1.00 mg/dL   Calcium 8.3 (L) 8.9 - 10.3 mg/dL   Total Protein 5.3 (L) 6.5 - 8.1 g/dL   Albumin 2.3 (L) 3.5 - 5.0 g/dL   AST 24 15 - 41 U/L   ALT 10 0 - 44 U/L   Alkaline Phosphatase 144 (H) 38 - 126 U/L   Total Bilirubin 2.6 (H) 0.3 - 1.2 mg/dL   GFR, Estimated >29 >52 mL/min   Anion gap 9 5 - 15  CBC     Status: Abnormal   Collection Time: 02/06/21  3:03 AM  Result Value Ref Range   WBC 15.9 (H) 4.0 - 10.5 K/uL   RBC 3.83 (L) 3.87 - 5.11 MIL/uL   Hemoglobin 10.3 (L) 12.0 - 15.0 g/dL   HCT 84.1 (L) 32.4 - 40.1 %   MCV 82.8 80.0 - 100.0 fL   MCH 26.9 26.0 - 34.0 pg   MCHC 32.5 30.0 - 36.0 g/dL   RDW 02.7 (H) 25.3 - 66.4 %   Platelets 113 (L) 150 - 400 K/uL   nRBC 0.0 0.0 - 0.2 %  Magnesium     Status: Abnormal   Collection Time: 02/06/21  3:03 AM  Result Value Ref Range   Magnesium 1.4 (L) 1.7 - 2.4 mg/dL  Phosphorus     Status: None   Collection Time: 02/06/21  3:03 AM  Result Value Ref Range   Phosphorus 3.8 2.5 - 4.6 mg/dL  DIC Panel Tomorrow AM 0500     Status: Abnormal   Collection Time: 02/06/21  3:03 AM  Result Value Ref Range   Prothrombin Time 17.4 (H) 11.4 - 15.2 seconds   INR 1.4 (H) 0.8 - 1.2   aPTT 34 24 - 36 seconds   Fibrinogen 156 (L) 210  - 475 mg/dL   D-Dimer, Quant >40.34 (H) 0.00 - 0.50 ug/mL-FEU   Platelets 109 (L) 150 - 400 K/uL   Smear Review NO SCHISTOCYTES SEEN     Assessment/Plan: Status post Cesarean section. Postoperative course complicated by hemorrhagic shock due to placental abruption, PPH and DIC. Currently in ICU. No signs of active bleeding, no need for reoperation or other other surgical intervention currently. - Will transfuse with 2 more pools or Cryo bringing total to 3 pools. Has already received 4 PRBCS, 2 FFP, 1 PLT, 1 CRYO. - Reassuring hemoglobin of 10.3, plt count 113K. JP output 135 ml so far. - Magnesium repleted. - Appreciate care by CCM team, patient may be able to transfer to [facility] if stable later today. - Continue routine postpartum care   Jaynie Collins, MD 02/06/2021, 6:17 AM

## 2021-02-06 NOTE — Progress Notes (Signed)
UNMATCHED BLOOD PRODUCT NOTE  Compare the patient ID on the blood tag to the patient ID on the hospital armband and Blood Bank armband. Then confirm the unit number on the blood tag matches the unit number on the blood product.  If a discrepancy is discovered return the product to blood bank immediately.   Blood Product Type: Cryoprecipitate  Unit #: (Found on blood product bag, begins with W) [id number]  Product Code #: (Found on blood product bag, begins with E) T0354   Start Time: 2250  Starting Rate: CRNA assisted in hanging product, product open to gravity per CRNA  Rate increase/decreased  (if applicable):      ml/hr  Rate changed time (if applicable):    Stop Time: 2310  Blood product verified by Lovenia Shuck, RN and Mertha Baars, RN   All Other Documentation should be documented within the Blood Admin Flowsheet per policy.

## 2021-02-07 LAB — BPAM CRYOPRECIPITATE
Blood Product Expiration Date: 202208021218
Blood Product Expiration Date: 202208021218
ISSUE DATE / TIME: 202208020708
ISSUE DATE / TIME: 202208020708
Unit Type and Rh: 5100
Unit Type and Rh: 5100

## 2021-02-07 LAB — PREPARE CRYOPRECIPITATE
Unit division: 0
Unit division: 0

## 2021-02-07 MED ORDER — FAMOTIDINE 20 MG PO TABS
20.0000 mg | ORAL_TABLET | Freq: Two times a day (BID) | ORAL | Status: DC
  Administered 2021-02-07 – 2021-02-08 (×3): 20 mg via ORAL
  Filled 2021-02-07 (×4): qty 1

## 2021-02-07 MED ORDER — FAMOTIDINE IN NACL 20-0.9 MG/50ML-% IV SOLN: 20.0000 mg | Freq: Two times a day (BID) | INTRAVENOUS | Status: DC | PRN

## 2021-02-07 MED ORDER — FUROSEMIDE 40 MG PO TABS
20.0000 mg | ORAL_TABLET | Freq: Once | ORAL | Status: AC
  Administered 2021-02-07: 20 mg via ORAL
  Filled 2021-02-07: qty 1

## 2021-02-07 MED ORDER — FUROSEMIDE 10 MG/ML IJ SOLN: 20.0000 mg | Freq: Once | INTRAMUSCULAR | Status: DC

## 2021-02-07 NOTE — Lactation Note (Signed)
This note was copied from a baby's chart. Lactation Consultation Note  Patient Name: Debra Bell [id number] Date: 02/07/2021 Reason for consult: Follow-up assessment;Early term 37-38.6wks Age:31 hours  LC in to room for follow up. Mother shares interest in using DEBP. LC demonstrated how to use manual and DEBP. Discussed milk storage, pumping frequency and care of parts.  LC pace bottlefed formula per mother's request since infant was cueing. Talked about supplementation volume and pacing when feeding.  Encouraged to contact [facility] for questions or concerns.  Maternal Data Has patient been taught Hand Expression?: Yes  Feeding Mother's Current Feeding Choice: Breast Milk and Formula Nipple Type: Slow - flow  Lactation Tools Discussed/Used Tools: Pump;Flanges Flange Size: 27;24 Breast pump type: Double-Electric Breast Pump;Manual Pump Education: Setup, frequency, and cleaning;Milk Storage Reason for Pumping: mother's request Pumping frequency: after feedings Pumped volume:  (drops)  Interventions Interventions: Breast feeding basics reviewed;DEBP;Hand pump;Expressed milk;Education  Discharge Pump: DEBP;Manual WIC Program: Yes  Consult Status Consult Status: Follow-up Date: 02/08/21 Follow-up type: In-patient    Debra Bell 02/07/2021, 3:05 PM

## 2021-02-07 NOTE — Progress Notes (Signed)
POSTPARTUM PROGRESS NOTE  POD #2  Subjective:  Debra Bell is a 30 y.o. [phone] s/p repeat C-section and Liletta IUD at [redacted]w[redacted]d. Today she notes severe mid-abdominal pain- started yesterday after dinner.  Constant pain, rates it 10/10.  Feeling better with sitting up.  Feeling nausea, no vomiting.. She denies any problems with ambulating, voiding or po intake. She has passed flatus, noBM.  Pain is related to mid-abdomen, denies pelvic pain or pressure.  Lochia minimal Denies fever/chills/chest pain/SOB.  no HA, no blurry vision, no RUQ pain  Objective: Blood pressure 109/70, pulse (!) 109, temperature 97.8 F (36.6 C), temperature source Oral, resp. rate 18, height 5\' 6"  (1.676 m), weight 127 kg, last menstrual period 05/08/2020, SpO2 100 %, unknown if currently breastfeeding.  Physical Exam:  General: alert, cooperative and no distress Chest: CTAB Heart: regular rate and rhythm Abdomen: soft, nontender, +BS Uterine Fundus: firm, appropriately tender Incision: with dressing- appears clean and dry JP drain: minimal output DVT Evaluation: No calf swelling or tenderness Extremities: SCDs in place, no edema Skin: warm, dry  Results for orders placed or performed during the hospital encounter of 02/05/21 (from the past 24 hour(s))  DIC Panel ONCE - STAT     Status: Abnormal   Collection Time: 02/06/21 11:57 AM  Result Value Ref Range   Prothrombin Time 15.5 (H) 11.4 - 15.2 seconds   INR 1.2 0.8 - 1.2   aPTT 31 24 - 36 seconds   Fibrinogen 325 210 - 475 mg/dL   D-Dimer, Quant 04/08/21 (H) 0.00 - 0.50 ug/mL-FEU   Platelets 113 (L) 150 - 400 K/uL   Smear Review NO SCHISTOCYTES SEEN   CBC     Status: Abnormal   Collection Time: 02/06/21 11:57 AM  Result Value Ref Range   WBC 15.9 (H) 4.0 - 10.5 K/uL   RBC 3.55 (L) 3.87 - 5.11 MIL/uL   Hemoglobin 9.5 (L) 12.0 - 15.0 g/dL   HCT 04/08/21 (L) 29.5 - 62.1 %   MCV 82.5 80.0 - 100.0 fL   MCH 26.8 26.0 - 34.0 pg   MCHC 32.4 30.0 - 36.0 g/dL    RDW 30.8 (H) 65.7 - 15.5 %   Platelets 113 (L) 150 - 400 K/uL   nRBC 0.0 0.0 - 0.2 %  Provider-confirm verbal Blood Bank order - RBC, FFP, Cryoprecipitate; >10 Units; Order taken: 02/05/2021; 8:05 PM; MTP Massive transfusion was activated during C section of patient. Patient received 2 FFP,4 RBC's, 4 Cryo, and 1 Platelet.     Status: None   Collection Time: 02/06/21 12:02 PM  Result Value Ref Range   Blood product order confirm      MD AUTHORIZATION REQUESTED Performed at [facility], [address], [place], [place]     Assessment/Plan: Debra Bell is a 30 y.o. [phone] s/p repeat C-section and Liletta IUD at [redacted]w[redacted]d POD#2 complicated by: 1) PPH with DIC- s/p 4pRBC, 2FFP, 1plt, 3 cryo -bleeding now appropriate, VSS, labs stable  2) Epigastric pain -plan for pepcid and gas-x -will continue to closely monitor  -continue routine postpartum care  Contraception: Liletta IUD Feeding: bottle  Dispo: continue with care as outlined above   LOS: 2 days   [redacted]w[redacted]d, DO Faculty Attending, [facility] 02/07/2021, 9:10 AM

## 2021-02-08 ENCOUNTER — Encounter: Payer: Medicaid Other | Admitting: Obstetrics

## 2021-02-08 DIAGNOSIS — O45029 Premature separation of placenta with disseminated intravascular coagulation, unspecified trimester: Secondary | ICD-10-CM | POA: Diagnosis not present

## 2021-02-08 LAB — CBC
HCT: 24.4 % — ABNORMAL LOW (ref 36.0–46.0)
Hemoglobin: 7.9 g/dL — ABNORMAL LOW (ref 12.0–15.0)
MCH: 27.3 pg (ref 26.0–34.0)
MCHC: 32.4 g/dL (ref 30.0–36.0)
MCV: 84.4 fL (ref 80.0–100.0)
Platelets: 121 10*3/uL — ABNORMAL LOW (ref 150–400)
RBC: 2.89 MIL/uL — ABNORMAL LOW (ref 3.87–5.11)
RDW: 18.5 % — ABNORMAL HIGH (ref 11.5–15.5)
WBC: 12.2 10*3/uL — ABNORMAL HIGH (ref 4.0–10.5)
nRBC: 0 % (ref 0.0–0.2)

## 2021-02-08 LAB — SURGICAL PATHOLOGY

## 2021-02-08 MED ORDER — IBUPROFEN 800 MG PO TABS: 800.0000 mg | ORAL_TABLET | Freq: Three times a day (TID) | ORAL | 3 refills | Status: DC | PRN

## 2021-02-08 MED ORDER — FERROUS SULFATE 325 (65 FE) MG PO TABS: 325.0000 mg | ORAL_TABLET | ORAL | 3 refills | Status: DC

## 2021-02-08 MED ORDER — OXYCODONE HCL 5 MG PO TABS: 5.0000 mg | ORAL_TABLET | ORAL | 0 refills | Status: DC | PRN

## 2021-02-08 MED ORDER — SODIUM CHLORIDE 0.9 % IV SOLN
500.0000 mg | Freq: Once | INTRAVENOUS | Status: AC
  Administered 2021-02-08: 500 mg via INTRAVENOUS
  Filled 2021-02-08: qty 25

## 2021-02-08 NOTE — Progress Notes (Signed)
Patient ID: Debra Bell, female   DOB: 17-Nov-1990, 30 y.o.   MRN: [id number] Attending Circumcision Counseling Progress Note  Patient desires circumcision for her female infant.  Circumcision procedure details discussed, risks and benefits of procedure were also discussed.  These include but are not limited to: Benefits of circumcision in men include reduction in the rates of urinary tract infection (UTI), penile cancer, some sexually transmitted infections, penile inflammatory and retractile disorders, as well as easier hygiene.  Risks include bleeding , infection, injury of glans which may lead to penile deformity or urinary tract issues, unsatisfactory cosmetic appearance and other potential complications related to the procedure.  It was emphasized that this is an elective procedure.  Patient wants to proceed with circumcision; written informed consent obtained.  Will do circumcision soon, routine circumcision and post circumcision care ordered for the infant.  [name] L. Alysia Penna, M.D. 02/08/2021 9:43 AM

## 2021-02-08 NOTE — Discharge Summary (Signed)
Postpartum Discharge Summary  Date of Service 02/08/2021     Patient Name: Debra Bell DOB: 07/27/1990 MRN: [id number]  Date of admission: 02/05/2021 Delivery date:02/05/2021  Delivering provider: Verita Schneiders A  Date of discharge: 02/08/2021  Admitting diagnosis: History of 2 cesarean sections [Z98.891] Intrauterine pregnancy: [redacted]w[redacted]d    Secondary diagnosis:  Principal Problem:   Placental abruption with disseminated intravascular coagulation, delivered Active Problems:   Previous cesarean delivery affecting pregnancy, antepartum   S/P cesarean section   Postpartum hemorrhage of 3 L   DIC (disseminated intravascular coagulation) ([facility]   Hemorrhagic shock ([employer]  Additional problems: NA    Discharge diagnosis: Preterm Pregnancy Delivered and Anemia                                              Post partum procedures:blood transfusion Augmentation: N/A Complications: Placental Abruption  Hospital course: Pt was admitted with above Dx. Underwent stat c section. Developed DIC to blood loss. See OP note for additional information. Post op was transferred to ICU for management of DIC. DIC corrected with blood products, Transferred out of ICU on POD # 1. Remained of post op course was unremarkable. She received IV iron prior to discharge home. Incision SQ drain removed on day of discharge without problems. Progressed to ambulating, voiding, tolerating diet and good oral pain control. Amendable for discharge home. Discharge instructions, medications, wound care and follow up reviewed with pt Pt verbalized understanding.   Magnesium Sulfate received: No BMZ received: No Rhophylac:N/A MMR:N/A T-DaP:Given prenatally Flu: N/A Transfusion:Yes  Physical exam  Vitals:   02/07/21 2326 02/08/21 0452 02/08/21 0749 02/08/21 1128  BP: 118/70 127/84 108/72 121/77  Pulse: (!) 109 (!) 102 92 (!) 102  Resp: _0 Temp: 99.2 F (37.3 C) 98.2 F (36.8 C) 98.1 F (36.7 C) 98.5 F  (36.9 C)  TempSrc: Oral Oral Oral Oral  SpO2:  99% 98% 99%  Weight:      Height:       General: alert Lochia: appropriate Uterine Fundus: firm Incision: Healing well with no significant drainage DVT Evaluation: No evidence of DVT seen on physical exam. Labs: Lab Results  Component Value Date   WBC 12.2 (H) 02/08/2021   HGB 7.9 (L) 02/08/2021   HCT 24.4 (L) 02/08/2021   MCV 84.4 02/08/2021   PLT 121 (L) 02/08/2021   CMP Latest Ref Rng & Units 02/06/2021  Glucose 70 - 99 mg/dL 136(H)  BUN 6 - 20 mg/dL 5(L)  Creatinine 0.44 - 1.00 mg/dL 0.61  Sodium 135 - 145 mmol/L 133(L)  Potassium 3.5 - 5.1 mmol/L 4.1  Chloride 98 - 111 mmol/L 105  CO2 22 - 32 mmol/L 19(L)  Calcium 8.9 - 10.3 mg/dL 8.3(L)  Total Protein 6.5 - 8.1 g/dL 5.3(L)  Total Bilirubin 0.3 - 1.2 mg/dL 2.6(H)  Alkaline Phos 38 - 126 U/L 144(H)  AST 15 - 41 U/L 24  ALT 0 - 44 U/L 10   Edinburgh Score: Edinburgh Postnatal Depression Scale Screening Tool 02/07/2021  I have been able to laugh and see the funny side of things. 0  I have looked forward with enjoyment to things. 1  I have blamed myself unnecessarily when things went wrong. 0  I have been anxious or worried for no good reason. 0  I have felt scared or panicky  for no good reason. 1  Things have been getting on top of me. 1  I have been so unhappy that I have had difficulty sleeping. 0  I have felt sad or miserable. 1  I have been so unhappy that I have been crying. 0  The thought of harming myself has occurred to me. 0  Edinburgh Postnatal Depression Scale Total 4      After visit meds:  Allergies as of 02/08/2021   No Known Allergies      Medication List     STOP taking these medications    Blood Pressure Kit Devi   ROLAIDS PO       TAKE these medications    ferrous sulfate 325 (65 FE) MG tablet Take 1 tablet (325 mg total) by mouth every other day. Start taking on: February 10, 2021   ibuprofen 800 MG tablet Commonly known as:  ADVIL Take 1 tablet (800 mg total) by mouth 3 (three) times daily with meals as needed for headache or moderate pain.   oxyCODONE 5 MG immediate release tablet Commonly known as: Oxy IR/ROXICODONE Take 1-2 tablets (5-10 mg total) by mouth every 4 (four) hours as needed for moderate pain.   Prenate Pixie 10-0.6-0.4-200 MG Caps Take 1 capsule by mouth daily.               Discharge Care Instructions  (From admission, onward)           Start     Ordered   02/08/21 0000  Discharge wound care:       Comments: Please check your my chart for appt this Monday for removal of staples   02/08/21 1415             Discharge home in stable condition Infant Feeding: Bottle Infant Disposition:home with mother Discharge instruction: per After Visit Summary and Postpartum booklet. Activity: Advance as tolerated. Pelvic rest for 6 weeks.  Diet: routine diet Anticipated Birth Control: IUD Postpartum Appointment 4 weeks Additional Postpartum F/U: Appt Monday 02/12/2021 for staple removal and incision check Future Appointments: Future Appointments  Date Time Provider [place]  02/12/2021 10:35 AM Woodroe Mode, MD [place] None   Follow up Visit:      02/08/2021 Chancy Milroy, MD

## 2021-02-08 NOTE — Plan of Care (Signed)
Patient to be discharged home with printed instructions.

## 2021-02-08 NOTE — Anesthesia Postprocedure Evaluation (Signed)
Anesthesia Post Note  Patient: Debra Bell  Procedure(s) Performed: CESAREAN SECTION     Patient location during evaluation: PACU Anesthesia Type: Spinal Level of consciousness: awake Pain management: pain level not controlled Vital Signs Assessment: post-procedure vital signs reviewed and stable Respiratory status: spontaneous breathing Cardiovascular status: stable Postop Assessment: no headache, no backache, spinal receding and no apparent nausea or vomiting Anesthetic complications: no   No notable events documented.  Last Vitals:  Vitals:   02/08/21 1128 02/08/21 1536  BP: 121/77 114/73  Pulse: (!) 102 94  Resp: 18 18  Temp: 36.9 C 36.8 C  SpO2: 99% 99%    Last Pain:  Vitals:   02/08/21 1536  TempSrc: Oral  PainSc:    Pain Goal: Patients Stated Pain Goal: 3 (02/08/21 1443)                 Caren Macadam

## 2021-02-09 ENCOUNTER — Ambulatory Visit: Payer: Medicaid Other

## 2021-02-09 ENCOUNTER — Other Ambulatory Visit: Payer: Medicaid Other

## 2021-02-09 LAB — BPAM RBC
Blood Product Expiration Date: 202208222359
Blood Product Expiration Date: 202208232359
Blood Product Expiration Date: 202208232359
Blood Product Expiration Date: 202208242359
Blood Product Expiration Date: 202208242359
Blood Product Expiration Date: 202208252359
Blood Product Expiration Date: 202208252359
Blood Product Expiration Date: 202208252359
Blood Product Expiration Date: 202208252359
Blood Product Expiration Date: 202208252359
ISSUE DATE / TIME: 202208011936
ISSUE DATE / TIME: 202208011936
ISSUE DATE / TIME: 202208012003
ISSUE DATE / TIME: 202208012003
ISSUE DATE / TIME: 202208012026
ISSUE DATE / TIME: 202208012026
ISSUE DATE / TIME: 202208031633
ISSUE DATE / TIME: 202208032036
ISSUE DATE / TIME: 202208041653
ISSUE DATE / TIME: 202208042235
Unit Type and Rh: 7300
Unit Type and Rh: 7300
Unit Type and Rh: 7300
Unit Type and Rh: 7300
Unit Type and Rh: 7300
Unit Type and Rh: 7300
Unit Type and Rh: 7300
Unit Type and Rh: 7300
Unit Type and Rh: 7300
Unit Type and Rh: 7300

## 2021-02-09 LAB — TYPE AND SCREEN
ABO/RH(D): B POS
Antibody Screen: NEGATIVE
Unit division: 0
Unit division: 0
Unit division: 0
Unit division: 0
Unit division: 0
Unit division: 0
Unit division: 0
Unit division: 0
Unit division: 0
Unit division: 0

## 2021-02-09 NOTE — Addendum Note (Signed)
Encounter addended by: Reva Bores, MD on: 02/09/2021 6:30 PM  Actions taken: Clinical Note Signed

## 2021-02-09 NOTE — Progress Notes (Signed)
Patient with hgb and anemia c/w iron deficiency. For IV iron infusion.

## 2021-02-12 ENCOUNTER — Encounter (HOSPITAL_COMMUNITY)
Admission: RE | Admit: 2021-02-12 | Discharge: 2021-02-12 | Disposition: A | Discharge status: Home / Self Care | Payer: Medicaid Other | Source: Ambulatory Visit | Attending: Obstetrics and Gynecology | Admitting: Obstetrics and Gynecology

## 2021-02-12 ENCOUNTER — Other Ambulatory Visit: Payer: Self-pay

## 2021-02-12 ENCOUNTER — Ambulatory Visit (INDEPENDENT_AMBULATORY_CARE_PROVIDER_SITE_OTHER): Payer: Medicaid Other | Admitting: Obstetrics & Gynecology

## 2021-02-12 ENCOUNTER — Encounter: Payer: Self-pay | Admitting: Obstetrics & Gynecology

## 2021-02-12 ENCOUNTER — Encounter (HOSPITAL_COMMUNITY): Payer: Medicaid Other

## 2021-02-12 VITALS — BP 112/72 | HR 102 | Wt 277.0 lb

## 2021-02-12 DIAGNOSIS — Z98891 History of uterine scar from previous surgery: Secondary | ICD-10-CM

## 2021-02-12 NOTE — Progress Notes (Signed)
Subjective:     Debra Bell is a 30 y.o. female who presents to the clinic 1 weeks status post  cesarean  for  emergency repeat CS for abruption . Eating a regular diet without difficulty. Bowel movements are abnormal with constipation .   The following portions of the patient's history were reviewed and updated as appropriate: allergies, current medications, past family history, past medical history, past social history, past surgical history, and problem list.  Review of Systems Pertinent items are noted in HPI.    Objective:    BP (!) 129/92   Pulse (!) 102   Wt 277 lb (125.6 kg)   LMP 05/08/2020 (Approximate)   Breastfeeding Yes   BMI 44.71 kg/m  General:  alert, cooperative, and no distress  Abdomen: soft, bowel sounds active, non-tender  Incision:   healing well, no erythema, no hernia, no seroma, no swelling, well approximated, incision well approximated    Staples removed. Some bloody drainage on right at site of JP drain which was removed 4 days ago Assessment:    Postoperative course complicated by bloody drainage on right  Operative findings again reviewed.    Plan:    1. Continue any current medications. 2. Wound care discussed. 3. Activity restrictions: no lifting more than 15 pounds 4. Anticipated return to work:  5. Follow up: 3 weeks for suture removal. Patient ID: Debra Bell, female   DOB: 1991-03-06, 30 y.o.   MRN: [id number]

## 2021-02-12 NOTE — Progress Notes (Signed)
Pt presents for staple removal s/p c/s on 02/05/21.

## 2021-02-14 ENCOUNTER — Inpatient Hospital Stay (HOSPITAL_COMMUNITY): Admit: 2021-02-14 | Payer: Medicaid Other | Admitting: Obstetrics and Gynecology

## 2021-02-17 ENCOUNTER — Telehealth (HOSPITAL_COMMUNITY): Payer: Self-pay

## 2021-02-17 NOTE — Telephone Encounter (Signed)
  No answer. The mailbox is full and cannot except any messages at this time.   Marcelino Duster [facility] 02/17/2021,1514

## 2021-02-20 ENCOUNTER — Ambulatory Visit (INDEPENDENT_AMBULATORY_CARE_PROVIDER_SITE_OTHER): Payer: Medicaid Other | Admitting: Obstetrics

## 2021-02-20 ENCOUNTER — Other Ambulatory Visit: Payer: Self-pay

## 2021-02-20 ENCOUNTER — Encounter: Payer: Self-pay | Admitting: Obstetrics

## 2021-02-20 VITALS — BP 149/99 | HR 66 | Ht 66.0 in | Wt 269.2 lb

## 2021-02-20 DIAGNOSIS — Z6841 Body Mass Index (BMI) 40.0 and over, adult: Secondary | ICD-10-CM | POA: Diagnosis not present

## 2021-02-20 DIAGNOSIS — O1205 Gestational edema, complicating the puerperium: Secondary | ICD-10-CM | POA: Diagnosis not present

## 2021-02-20 DIAGNOSIS — O165 Unspecified maternal hypertension, complicating the puerperium: Secondary | ICD-10-CM | POA: Diagnosis not present

## 2021-02-20 MED ORDER — TRIAMTERENE-HCTZ 37.5-25 MG PO CAPS: 1.0000 | ORAL_CAPSULE | Freq: Every morning | ORAL | 5 refills | Status: DC

## 2021-02-20 NOTE — Progress Notes (Signed)
Patient presents for swollen ankles two weeks postpartum. Patient states that she has been also having a headache for the past 2 days. Seen bright flash in right eye yesterday and now blurred vision in right eye. She has +2 , +3 pitting edema bilaterally. Legs are sore up to her knees.   PPD score: 1

## 2021-02-20 NOTE — Progress Notes (Signed)
Patient ID: Debra Bell, female   DOB: 10-03-1990, 30 y.o.   MRN: [id number]  Chief Complaint  Patient presents with   Edema    HPI Debra Bell is a 30 y.o. female.  Complains of headaches, swelling of feet and occasional visual changes over the past 2 days. HPI  Past Medical History:  Diagnosis Date   Asthma    Bilateral ovarian cysts    History of ovarian cyst     Past Surgical History:  Procedure Laterality Date   CESAREAN SECTION     CESAREAN SECTION N/A 02/05/2021   Procedure: CESAREAN SECTION;  Surgeon: Tereso Newcomer, MD;  Location: [facility];  Service: Obstetrics;  Laterality: N/A;   OVARIAN CYST REMOVAL  2015   and 2013    History reviewed. No pertinent family history.  Social History Social History   Tobacco Use   Smoking status: Never   Smokeless tobacco: Never  Vaping Use   Vaping Use: Never used  Substance Use Topics   Alcohol use: Never   Drug use: Never    No Known Allergies  Current Outpatient Medications  Medication Sig Dispense Refill   ferrous sulfate 325 (65 FE) MG tablet Take 1 tablet (325 mg total) by mouth every other day. 30 tablet 3   ibuprofen (ADVIL) 800 MG tablet Take 1 tablet (800 mg total) by mouth 3 (three) times daily with meals as needed for headache or moderate pain. 30 tablet 3   Prenat-FeAsp-Meth-FA-DHA w/o A (PRENATE PIXIE) 10-0.6-0.4-200 MG CAPS Take 1 capsule by mouth daily. (Patient not taking: Reported on 02/20/2021) 30 capsule 11   No current facility-administered medications for this visit.    Review of Systems Review of Systems Constitutional: negative for fatigue and weight loss Respiratory: negative for cough and wheezing Cardiovascular: negative for chest pain, fatigue and palpitations Gastrointestinal: negative for abdominal pain and change in bowel habits Genitourinary:negative Integument/breast: negative for nipple discharge Musculoskeletal:negative for myalgias Neurological: positive for  headache Behavioral/Psych: negative for abusive relationship, depression Endocrine: negative for temperature intolerance      Blood pressure (!) 148/99, pulse 64, height 5\' 6"  (1.676 m), weight 269 lb 3.2 oz (122.1 kg), last menstrual period 05/08/2020, not currently breastfeeding.  Physical Exam Physical Exam General:   Alert and no distress  Skin:   no rash or abnormalities  Lungs:   clear to auscultation bilaterally  Heart:   regular rate and rhythm, S1, S2 normal, no murmur, click, rub or gallop  Breasts:   normal without suspicious masses, skin or nipple changes or axillary nodes  Abdomen:  normal findings: no organomegaly, soft, non-tender and no hernia Incision is clean, dry, intact and non tender   I have spent a total of 30 minutes of face-to-face time, excluding clinical staff time, reviewing notes and preparing to see patient, ordering tests and/or medications, and counseling the patient.   Data Reviewed Labs  Assessment     1. Postpartum hypertension Rx: - Protein / creatinine ratio, urine - CBC - Comprehensive metabolic panel - triamterene-hydrochlorothiazide (DYAZIDE) 37.5-25 MG capsule; Take 1 each (1 capsule total) by mouth every morning.  Dispense: 30 capsule; Refill: 5  2. Postpartum edema Rx: - triamterene-hydrochlorothiazide (DYAZIDE) 37.5-25 MG capsule; Take 1 each (1 capsule total) by mouth every morning.  Dispense: 30 capsule; Refill: 5  3. Class 3 severe obesity due to excess calories without serious comorbidity with body mass index (BMI) of 40.0 to 44.9 in adult [facility])  Plan                                                                                                                                                                                                                             Rest, increase fluids and follow up in 2 weeks  HCTZ Rx for edema and mild antihypertensive effect  Orders Placed This Encounter  Procedures   Protein /  creatinine ratio, urine   CBC   Comprehensive metabolic panel     Brock Bad, MD 02/20/2021 9:31 AM

## 2021-02-21 ENCOUNTER — Observation Stay (HOSPITAL_COMMUNITY)
Admission: AD | Admit: 2021-02-21 | Discharge: 2021-02-22 | Disposition: A | Discharge status: Home / Self Care | Payer: Medicaid Other | Attending: Emergency Medicine | Admitting: Emergency Medicine

## 2021-02-21 ENCOUNTER — Emergency Department (HOSPITAL_COMMUNITY): Payer: Medicaid Other

## 2021-02-21 ENCOUNTER — Encounter (HOSPITAL_COMMUNITY): Payer: Self-pay | Admitting: Obstetrics & Gynecology

## 2021-02-21 DIAGNOSIS — Z98891 History of uterine scar from previous surgery: Secondary | ICD-10-CM

## 2021-02-21 DIAGNOSIS — O1415 Severe pre-eclampsia, complicating the puerperium: Principal | ICD-10-CM | POA: Diagnosis present

## 2021-02-21 DIAGNOSIS — Z79899 Other long term (current) drug therapy: Secondary | ICD-10-CM | POA: Diagnosis not present

## 2021-02-21 DIAGNOSIS — O1495 Unspecified pre-eclampsia, complicating the puerperium: Secondary | ICD-10-CM | POA: Diagnosis not present

## 2021-02-21 DIAGNOSIS — Z20822 Contact with and (suspected) exposure to covid-19: Secondary | ICD-10-CM | POA: Insufficient documentation

## 2021-02-21 DIAGNOSIS — J45909 Unspecified asthma, uncomplicated: Secondary | ICD-10-CM | POA: Insufficient documentation

## 2021-02-21 DIAGNOSIS — I10 Essential (primary) hypertension: Secondary | ICD-10-CM | POA: Diagnosis not present

## 2021-02-21 DIAGNOSIS — J90 Pleural effusion, not elsewhere classified: Secondary | ICD-10-CM | POA: Diagnosis not present

## 2021-02-21 DIAGNOSIS — O1205 Gestational edema, complicating the puerperium: Secondary | ICD-10-CM

## 2021-02-21 LAB — COMPREHENSIVE METABOLIC PANEL
ALT: 37 U/L (ref 0–44)
ALT: 37 U/L (ref 0–44)
AST: 49 U/L — ABNORMAL HIGH (ref 15–41)
AST: 52 U/L — ABNORMAL HIGH (ref 15–41)
Albumin: 3.3 g/dL — ABNORMAL LOW (ref 3.5–5.0)
Albumin: 3.8 g/dL (ref 3.5–5.0)
Alkaline Phosphatase: 149 U/L — ABNORMAL HIGH (ref 38–126)
Alkaline Phosphatase: 173 U/L — ABNORMAL HIGH (ref 38–126)
Anion gap: 10 (ref 5–15)
Anion gap: 12 (ref 5–15)
BUN: 10 mg/dL (ref 6–20)
BUN: 9 mg/dL (ref 6–20)
CO2: 27 mmol/L (ref 22–32)
CO2: 29 mmol/L (ref 22–32)
Calcium: 9.1 mg/dL (ref 8.9–10.3)
Calcium: 9.2 mg/dL (ref 8.9–10.3)
Chloride: 101 mmol/L (ref 98–111)
Chloride: 98 mmol/L (ref 98–111)
Creatinine, Ser: 1.06 mg/dL — ABNORMAL HIGH (ref 0.44–1.00)
Creatinine, Ser: 0.96 mg/dL (ref 0.44–1.00)
GFR, Estimated: >60 mL/min (ref >60)
GFR, Estimated: >60 mL/min (ref >60)
Glucose, Bld: 95 mg/dL (ref 70–99)
Glucose, Bld: 104 mg/dL — ABNORMAL HIGH (ref 70–99)
Potassium: 3.2 mmol/L — ABNORMAL LOW (ref 3.5–5.1)
Potassium: 3.4 mmol/L — ABNORMAL LOW (ref 3.5–5.1)
Sodium: 138 mmol/L (ref 135–145)
Sodium: 139 mmol/L (ref 135–145)
Total Bilirubin: 2 mg/dL — ABNORMAL HIGH (ref 0.3–1.2)
Total Bilirubin: 3 mg/dL — ABNORMAL HIGH (ref 0.3–1.2)
Total Protein: 6.7 g/dL (ref 6.5–8.1)
Total Protein: 7.9 g/dL (ref 6.5–8.1)

## 2021-02-21 LAB — CBC WITH DIFFERENTIAL/PLATELET
Abs Immature Granulocytes: 0.05 10*3/uL (ref 0.00–0.07)
Basophils Absolute: 0 10*3/uL (ref 0.0–0.1)
Basophils Relative: 0 %
Eosinophils Absolute: 0 10*3/uL (ref 0.0–0.5)
Eosinophils Relative: 0 %
HCT: 31.7 % — ABNORMAL LOW (ref 36.0–46.0)
Hemoglobin: 9.5 g/dL — ABNORMAL LOW (ref 12.0–15.0)
Immature Granulocytes: 1 %
Lymphocytes Relative: 19 %
Lymphs Abs: 1 10*3/uL (ref 0.7–4.0)
MCH: 26.1 pg (ref 26.0–34.0)
MCHC: 30 g/dL (ref 30.0–36.0)
MCV: 87.1 fL (ref 80.0–100.0)
Monocytes Absolute: 0.5 10*3/uL (ref 0.1–1.0)
Monocytes Relative: 10 %
Neutro Abs: 3.4 10*3/uL (ref 1.7–7.7)
Neutrophils Relative %: 70 %
Platelets: 250 10*3/uL (ref 150–400)
RBC: 3.64 MIL/uL — ABNORMAL LOW (ref 3.87–5.11)
RDW: 18.7 % — ABNORMAL HIGH (ref 11.5–15.5)
WBC: 5 10*3/uL (ref 4.0–10.5)
nRBC: 0 % (ref 0.0–0.2)

## 2021-02-21 LAB — URINALYSIS, ROUTINE W REFLEX MICROSCOPIC
Bacteria, UA: NONE SEEN
Bilirubin Urine: NEGATIVE
Glucose, UA: NEGATIVE mg/dL
Ketones, ur: NEGATIVE mg/dL
Nitrite: NEGATIVE
Protein, ur: 100 mg/dL — AB
Specific Gravity, Urine: 1.014 (ref 1.005–1.030)
pH: 7 (ref 5.0–8.0)

## 2021-02-21 LAB — PROTEIN / CREATININE RATIO, URINE
Creatinine, Urine: 62.71 mg/dL
Protein Creatinine Ratio: 2.15 mg/mg{Cre} — ABNORMAL HIGH (ref 0.00–0.15)
Total Protein, Urine: 135 mg/dL

## 2021-02-21 LAB — RESP PANEL BY RT-PCR (FLU A&B, COVID) ARPGX2
Influenza A by PCR: NEGATIVE
Influenza B by PCR: NEGATIVE
SARS Coronavirus 2 by RT PCR: NEGATIVE

## 2021-02-21 LAB — BRAIN NATRIURETIC PEPTIDE
B Natriuretic Peptide: 272.3 pg/mL — ABNORMAL HIGH (ref 0.0–100.0)
B Natriuretic Peptide: 257.7 pg/mL — ABNORMAL HIGH (ref 0.0–100.0)

## 2021-02-21 LAB — TROPONIN I (HIGH SENSITIVITY)
Troponin I (High Sensitivity): 15 ng/L (ref <18)
Troponin I (High Sensitivity): 23 ng/L — ABNORMAL HIGH (ref <18)

## 2021-02-21 MED ORDER — OXYCODONE-ACETAMINOPHEN 5-325 MG PO TABS
1.0000 | ORAL_TABLET | ORAL | Status: DC | PRN
  Administered 2021-02-21 (×2): 2 via ORAL
  Administered 2021-02-22: 1 via ORAL
  Filled 2021-02-21: qty 2
  Filled 2021-02-21: qty 1
  Filled 2021-02-21: qty 2

## 2021-02-21 MED ORDER — OXYCODONE-ACETAMINOPHEN 5-325 MG PO TABS
1.0000 | ORAL_TABLET | Freq: Four times a day (QID) | ORAL | Status: DC | PRN
  Administered 2021-02-21: 2 via ORAL
  Filled 2021-02-21: qty 2

## 2021-02-21 MED ORDER — LABETALOL HCL 5 MG/ML IV SOLN: 40.0000 mg | INTRAVENOUS | Status: DC | PRN

## 2021-02-21 MED ORDER — LABETALOL HCL 5 MG/ML IV SOLN: 80.0000 mg | INTRAVENOUS | Status: DC | PRN

## 2021-02-21 MED ORDER — ONDANSETRON 4 MG PO TBDP
8.0000 mg | ORAL_TABLET | Freq: Three times a day (TID) | ORAL | Status: DC | PRN
  Administered 2021-02-21: 8 mg via ORAL
  Filled 2021-02-21: qty 2

## 2021-02-21 MED ORDER — FUROSEMIDE 10 MG/ML IJ SOLN
20.0000 mg | Freq: Once | INTRAMUSCULAR | Status: AC
  Administered 2021-02-21: 20 mg via INTRAVENOUS
  Filled 2021-02-21: qty 2

## 2021-02-21 MED ORDER — LABETALOL HCL 5 MG/ML IV SOLN: 20.0000 mg | INTRAVENOUS | Status: DC | PRN

## 2021-02-21 MED ORDER — HYDRALAZINE HCL 20 MG/ML IJ SOLN: 10.0000 mg | INTRAMUSCULAR | Status: DC | PRN

## 2021-02-21 MED ORDER — NIFEDIPINE ER OSMOTIC RELEASE 30 MG PO TB24: 30.0000 mg | ORAL_TABLET | Freq: Two times a day (BID) | ORAL | Status: DC

## 2021-02-21 MED ORDER — NIFEDIPINE 10 MG PO CAPS
20.0000 mg | ORAL_CAPSULE | Freq: Once | ORAL | Status: AC
  Administered 2021-02-21: 20 mg via ORAL
  Filled 2021-02-21: qty 2

## 2021-02-21 MED ORDER — MAGNESIUM SULFATE 40 GM/1000ML IV SOLN
2.0000 g/h | INTRAVENOUS | Status: DC
  Administered 2021-02-21 – 2021-02-22 (×2): 2 g/h via INTRAVENOUS
  Filled 2021-02-21 (×2): qty 1000

## 2021-02-21 MED ORDER — NIFEDIPINE ER OSMOTIC RELEASE 30 MG PO TB24
30.0000 mg | ORAL_TABLET | Freq: Two times a day (BID) | ORAL | Status: DC
  Administered 2021-02-21 – 2021-02-22 (×2): 30 mg via ORAL
  Filled 2021-02-21 (×2): qty 1

## 2021-02-21 MED ORDER — PRENATAL MULTIVITAMIN CH
1.0000 | ORAL_TABLET | Freq: Every day | ORAL | Status: DC
  Administered 2021-02-22: 1 via ORAL
  Filled 2021-02-21: qty 1

## 2021-02-21 MED ORDER — LACTATED RINGERS IV SOLN: INTRAVENOUS | Status: DC

## 2021-02-21 MED ORDER — MAGNESIUM SULFATE BOLUS VIA INFUSION
4.0000 g | Freq: Once | INTRAVENOUS | Status: AC
  Administered 2021-02-21: 4 g via INTRAVENOUS
  Filled 2021-02-21: qty 1000

## 2021-02-21 NOTE — MAU Note (Signed)
PT SAYS SHE DEL BY C/S ON 8-1- WENT HOME ON 8-6 BC HAD AN ABRUPTION H/A STARTED YESTERDAY - TOOK IBUPROPHEN - AT 12 MN-800MG . NO BP'S PROBLEMS WHEN HERE.  VOMITING - STARTED 0400.

## 2021-02-21 NOTE — ED Provider Notes (Signed)
Emergency Medicine Provider Triage Evaluation Note  Debra Bell , a 30 y.o. female  was evaluated in triage.  Pt complains of headache and HTN.  She is 2 weeks post-partum from c-section.  Seen in clinic with Dr. Clearance Coots yesterday and started on Dyazide.  States LE swelling has worsened and pressures are increasing despite medications.  No hx of HTN during pregnancy.  BP 163/115 last check before leaving home.  Does report foamy urine and frequency.  Brief episode of chest pain earlier that resolved quickly.  Review of Systems  Positive: Headache, HTN Negative: seizures  Physical Exam  BP (!) 144/105 (BP Location: Left Arm)   Pulse 95   Temp 98.3 F (36.8 C) (Oral)   Resp 16   Ht 5\' 6"  (1.676 m)   Wt 122 kg   LMP 05/08/2020 (Approximate)   SpO2 96%   Breastfeeding Unknown   BMI 43.41 kg/m   Gen:   Awake, no distress   Resp:  Normal effort  MSK:   Moves extremities without difficulty  Other:  2+ edema of the feet  Medical Decision Making  Medically screening exam initiated at 4:53 AM.  Appropriate orders placed.  13/07/2019 was informed that the remainder of the evaluation will be completed by another provider, this initial triage assessment does not replace that evaluation, and the importance of remaining in the ED until their evaluation is complete.  BP 144/105 currently.  Concern for post-partum pre-eclampsia given her symptoms.  No focal deficits at present.  Ordered labs including urine protein/creatine ratio, trop, BNP, CXR.  Discussed with [name] at [facility] (answering for CNM Diplomatic Services operational officer)-- recommends transfer given she is post-partum.   Hilda Lias, PA-C 02/21/21 0508    02/23/21, DO 02/21/21 02/23/21

## 2021-02-21 NOTE — MAU Provider Note (Signed)
Chief Complaint:  Hypertension and Headache   Event Date/Time   First Provider Initiated Contact with Patient 02/21/21 0542     HPI: Debra Bell is a 30 y.o. [id number] who presents POD#16 to ED/maternity admissions reporting elevated BP and persistent headache.  Seen by Dr Clearance Coots yesterday for the same symptoms.  Treated with Dyazide.  . She reports vaginal bleeding, h/a, but no dizziness, n/v, or fever/chills.    History is remarkable for placental abruption with Code Cesarean, large blood loss and development of DIC.   She was recovered in ICU then discharged one week later.   Hypertension This is a recurrent problem. The current episode started yesterday. The problem has been gradually worsening since onset. Associated symptoms include headaches and peripheral edema. Pertinent negatives include no anxiety, blurred vision or chest pain. There are no associated agents to hypertension. There are no known risk factors for coronary artery disease. Past treatments include diuretics. The current treatment provides no improvement. There are no compliance problems.   Headache  This is a recurrent problem. The current episode started yesterday. The problem has been unchanged. The quality of the pain is described as aching. Pertinent negatives include no blurred vision, fever or photophobia. Nothing aggravates the symptoms. She has tried nothing for the symptoms. Her past medical history is significant for hypertension.  RN Note: PT SAYS SHE DEL BY C/S ON 8-1- WENT HOME ON 8-6 BC HAD AN ABRUPTION H/A STARTED YESTERDAY - TOOK IBUPROPHEN - AT 12 MN-800MG . NO BP'S PROBLEMS WHEN HERE.  VOMITING - STARTED 0400.  ED Note: Debra Bell , a 30 y.o. female  was evaluated in triage.  Pt complains of headache and HTN.  She is 2 weeks post-partum from c-section.  Seen in clinic with Dr. Clearance Coots yesterday and started on Dyazide.  States LE swelling has worsened and pressures are increasing despite  medications.  No hx of HTN during pregnancy.  BP 163/115 last check before leaving home.  Does report foamy urine and frequency.  Brief episode of chest pain earlier that resolved quickly.  Past Medical History: Past Medical History:  Diagnosis Date   Asthma    Bilateral ovarian cysts    History of ovarian cyst     Past obstetric history: OB History  Gravida Para Term Preterm AB Living  4 3 3   1 3   SAB IAB Ectopic Multiple Live Births    1   0 3    # Outcome Date GA Lbr Len/2nd Weight Sex Delivery Anes PTL Lv  4 Term 02/05/21 [redacted]w[redacted]d  2940 g M CS-LTranv Spinal  LIV  3 IAB           2 Term     M CS-LTranv   LIV  1 Term     M CS-LTranv   LIV    Past Surgical History: Past Surgical History:  Procedure Laterality Date   CESAREAN SECTION     CESAREAN SECTION N/A 02/05/2021   Procedure: CESAREAN SECTION;  Surgeon: 04/07/2021, MD;  Location: [facility];  Service: Obstetrics;  Laterality: N/A;   OVARIAN CYST REMOVAL  2015   and 2013    Family History: No family history on file.  Social History: Social History   Tobacco Use   Smoking status: Never   Smokeless tobacco: Never  Vaping Use   Vaping Use: Never used  Substance Use Topics   Alcohol use: Never   Drug use: Never    Allergies: No  Known Allergies  Meds:  Medications Prior to Admission  Medication Sig Dispense Refill Last Dose   ferrous sulfate 325 (65 FE) MG tablet Take 1 tablet (325 mg total) by mouth every other day. 30 tablet 3    ibuprofen (ADVIL) 800 MG tablet Take 1 tablet (800 mg total) by mouth 3 (three) times daily with meals as needed for headache or moderate pain. 30 tablet 3    Prenat-FeAsp-Meth-FA-DHA w/o A (PRENATE PIXIE) 10-0.6-0.4-200 MG CAPS Take 1 capsule by mouth daily. (Patient not taking: Reported on 02/20/2021) 30 capsule 11    triamterene-hydrochlorothiazide (DYAZIDE) 37.5-25 MG capsule Take 1 each (1 capsule total) by mouth every morning. 30 capsule 5     I have reviewed patient's  Past Medical Hx, Surgical Hx, Family Hx, Social Hx, medications and allergies.  ROS:  Review of Systems  Constitutional:  Negative for fever.  Eyes:  Negative for blurred vision and photophobia.  Cardiovascular:  Negative for chest pain.  Neurological:  Positive for headaches.  Other systems negative     Physical Exam  Patient Vitals for the past 24 hrs:  BP Temp Temp src Pulse Resp SpO2 Height Weight  02/21/21 0541 (!) 176/97 98.8 F (37.1 C) -- -- (!) 24 -- -- --  02/21/21 0537 -- -- -- -- -- -- 5\' 6"  (1.676 m) 119.2 kg  02/21/21 0459 (!) 144/105 -- -- 95 16 96 % -- --  02/21/21 0457 -- 98.3 F (36.8 C) Oral -- -- -- -- --  02/21/21 0456 -- -- -- -- -- -- 5\' 6"  (1.676 m) 122 kg   Vitals:   02/21/21 0555 02/21/21 0615 02/21/21 0650 02/21/21 0652  BP: (!) 153/101 (!) 158/97 (!) 161/99 (!) 161/99  Pulse: 79 77 79   Resp:      Temp:      TempSrc:      SpO2:      Weight:      Height:        Constitutional: Well-developed, well-nourished female in no acute distress.  Cardiovascular: normal rate and rhythm, no ectopy audible, S1 & S2 heard, no murmur Respiratory: normal effort, no distress. Lungs CTAB with no wheezes or crackles GI: Abd soft, non-tender.  Nondistended.  No rebound, No guarding.  Bowel Sounds audible  MS: Extremities nontender, 1+ edema, normal ROM Neurologic: Alert and oriented x 4.   Grossly nonfocal.  DTRs 3+, no clonus GU: Neg CVAT. Skin:  Warm and Dry Psych:  Affect appropriate.   Labs: Results for orders placed or performed during the hospital encounter of 02/21/21 (from the past 24 hour(s))  CBC with Differential     Status: Abnormal   Collection Time: 02/21/21  5:11 AM  Result Value Ref Range   WBC 5.0 4.0 - 10.5 K/uL   RBC 3.64 (L) 3.87 - 5.11 MIL/uL   Hemoglobin 9.5 (L) 12.0 - 15.0 g/dL   HCT 02/23/21 (L) 02/23/21 - 54.0 %   MCV 87.1 80.0 - 100.0 fL   MCH 26.1 26.0 - 34.0 pg   MCHC 30.0 30.0 - 36.0 g/dL   RDW 08.6 (H) 76.1 - 95.0 %   Platelets  250 150 - 400 K/uL   nRBC 0.0 0.0 - 0.2 %   Neutrophils Relative % 70 %   Neutro Abs 3.4 1.7 - 7.7 K/uL   Lymphocytes Relative 19 %   Lymphs Abs 1.0 0.7 - 4.0 K/uL   Monocytes Relative 10 %   Monocytes Absolute 0.5 0.1 - 1.0  K/uL   Eosinophils Relative 0 %   Eosinophils Absolute 0.0 0.0 - 0.5 K/uL   Basophils Relative 0 %   Basophils Absolute 0.0 0.0 - 0.1 K/uL   Immature Granulocytes 1 %   Abs Immature Granulocytes 0.05 0.00 - 0.07 K/uL  Comprehensive metabolic panel     Status: Abnormal   Collection Time: 02/21/21  5:11 AM  Result Value Ref Range   Sodium 138 135 - 145 mmol/L   Potassium 3.2 (L) 3.5 - 5.1 mmol/L   Chloride 101 98 - 111 mmol/L   CO2 27 22 - 32 mmol/L   Glucose, Bld 95 70 - 99 mg/dL   BUN 10 6 - 20 mg/dL   Creatinine, Ser 1.06 (H) 0.44 - 1.00 mg/dL   Calcium 9.1 8.9 - 10.3 mg/dL   Total Protein 6.7 6.5 - 8.1 g/dL   Albumin 3.3 (L) 3.5 - 5.0 g/dL   AST 49 (H) 15 - 41 U/L   ALT 37 0 - 44 U/L   Alkaline Phosphatase 149 (H) 38 - 126 U/L   Total Bilirubin 2.0 (H) 0.3 - 1.2 mg/dL   GFR, Estimated >60 >60 mL/min   Anion gap 10 5 - 15  Troponin I (High Sensitivity)     Status: None   Collection Time: 02/21/21  5:11 AM  Result Value Ref Range   Troponin I (High Sensitivity) 15 <18 ng/L  Brain natriuretic peptide     Status: Abnormal   Collection Time: 02/21/21  5:12 AM  Result Value Ref Range   B Natriuretic Peptide 272.3 (H) 0.0 - 100.0 pg/mL  Urinalysis, Routine w reflex microscopic     Status: Abnormal   Collection Time: 02/21/21  6:04 AM  Result Value Ref Range   Color, Urine AMBER (A) YELLOW   APPearance CLEAR CLEAR   Specific Gravity, Urine 1.014 1.005 - 1.030   pH 7.0 5.0 - 8.0   Glucose, UA NEGATIVE NEGATIVE mg/dL   Hgb urine dipstick MODERATE (A) NEGATIVE   Bilirubin Urine NEGATIVE NEGATIVE   Ketones, ur NEGATIVE NEGATIVE mg/dL   Protein, ur 100 (A) NEGATIVE mg/dL   Nitrite NEGATIVE NEGATIVE   Leukocytes,Ua TRACE (A) NEGATIVE   RBC / HPF 6-10 0  - 5 RBC/hpf   WBC, UA 11-20 0 - 5 WBC/hpf   Bacteria, UA NONE SEEN NONE SEEN   Mucus PRESENT    Hyaline Casts, UA PRESENT   Protein / creatinine ratio, urine     Status: Abnormal   Collection Time: 02/21/21  6:04 AM  Result Value Ref Range   Creatinine, Urine 62.71 mg/dL   Total Protein, Urine 135 mg/dL   Protein Creatinine Ratio 2.15 (H) 0.00 - 0.15 mg/mg[Cre]     --/--/B POS Performed at [facility], [address] [place], [place] [address]  (08/01 1816)  Imaging:    [facility] Course/MDM: I have ordered labs as follows: see above.  BNP is elevated.  Troponin normal  Creatinine elevated as is AST.  Protein Creatinine Ratio significantly elevated Imaging ordered: EKG Sinus Rhythm Results reviewed.   Consult Dr Arnold Procardia 20mg po given for hypertension.  .   Treatments in [facility]U included Zofran, Percocet (for HA)..    Assessment: PostOperative Day #16 Preeclampsia with severe features,  Hypertension with Headache Elevated BNP and Creatinine  Plan: Admit for observation  MD to follow.   Debra Bell CNM, MSN Certified Nurse-Midwife 02/21/2021 5:42 AM  

## 2021-02-21 NOTE — Plan of Care (Signed)
  Problem: Education: Goal: Knowledge of General Education information will improve Description: Including pain rating scale, medication(s)/side effects and non-pharmacologic comfort measures Outcome: Completed/Met

## 2021-02-21 NOTE — H&P (Signed)
Chief Complaint:  Hypertension and Headache   Event Date/Time   First Provider Initiated Contact with Patient 02/21/21 0542     HPI: Debra Bell is a 30 y.o. [id number] who presents POD#16 to ED/maternity admissions reporting elevated BP and persistent headache.  Seen by Dr Clearance Coots yesterday for the same symptoms.  Treated with Dyazide.  . She reports vaginal bleeding, h/a, but no dizziness, n/v, or fever/chills.    History is remarkable for placental abruption with Code Cesarean, large blood loss and development of DIC.   She was recovered in ICU then discharged one week later.   Hypertension This is a recurrent problem. The current episode started yesterday. The problem has been gradually worsening since onset. Associated symptoms include headaches and peripheral edema. Pertinent negatives include no anxiety, blurred vision or chest pain. There are no associated agents to hypertension. There are no known risk factors for coronary artery disease. Past treatments include diuretics. The current treatment provides no improvement. There are no compliance problems.   Headache  This is a recurrent problem. The current episode started yesterday. The problem has been unchanged. The quality of the pain is described as aching. Pertinent negatives include no blurred vision, fever or photophobia. Nothing aggravates the symptoms. She has tried nothing for the symptoms. Her past medical history is significant for hypertension.  RN Note: PT SAYS SHE DEL BY C/S ON 8-1- WENT HOME ON 8-6 BC HAD AN ABRUPTION H/A STARTED YESTERDAY - TOOK IBUPROPHEN - AT 12 MN-800MG . NO BP'S PROBLEMS WHEN HERE.  VOMITING - STARTED 0400.  ED Note: Debra Bell , a 30 y.o. female  was evaluated in triage.  Pt complains of headache and HTN.  She is 2 weeks post-partum from c-section.  Seen in clinic with Dr. Clearance Coots yesterday and started on Dyazide.  States LE swelling has worsened and pressures are increasing despite  medications.  No hx of HTN during pregnancy.  BP 163/115 last check before leaving home.  Does report foamy urine and frequency.  Brief episode of chest pain earlier that resolved quickly.  Past Medical History: Past Medical History:  Diagnosis Date   Asthma    Bilateral ovarian cysts    History of ovarian cyst     Past obstetric history: OB History  Gravida Para Term Preterm AB Living  4 3 3   1 3   SAB IAB Ectopic Multiple Live Births    1   0 3    # Outcome Date GA Lbr Len/2nd Weight Sex Delivery Anes PTL Lv  4 Term 02/05/21 [redacted]w[redacted]d  2940 g M CS-LTranv Spinal  LIV  3 IAB           2 Term     M CS-LTranv   LIV  1 Term     M CS-LTranv   LIV    Past Surgical History: Past Surgical History:  Procedure Laterality Date   CESAREAN SECTION     CESAREAN SECTION N/A 02/05/2021   Procedure: CESAREAN SECTION;  Surgeon: 04/07/2021, MD;  Location: [facility];  Service: Obstetrics;  Laterality: N/A;   OVARIAN CYST REMOVAL  2015   and 2013    Family History: No family history on file.  Social History: Social History   Tobacco Use   Smoking status: Never   Smokeless tobacco: Never  Vaping Use   Vaping Use: Never used  Substance Use Topics   Alcohol use: Never   Drug use: Never    Allergies: No  Known Allergies  Meds:  Medications Prior to Admission  Medication Sig Dispense Refill Last Dose   ferrous sulfate 325 (65 FE) MG tablet Take 1 tablet (325 mg total) by mouth every other day. 30 tablet 3    ibuprofen (ADVIL) 800 MG tablet Take 1 tablet (800 mg total) by mouth 3 (three) times daily with meals as needed for headache or moderate pain. 30 tablet 3    Prenat-FeAsp-Meth-FA-DHA w/o A (PRENATE PIXIE) 10-0.6-0.4-200 MG CAPS Take 1 capsule by mouth daily. (Patient not taking: Reported on 02/20/2021) 30 capsule 11    triamterene-hydrochlorothiazide (DYAZIDE) 37.5-25 MG capsule Take 1 each (1 capsule total) by mouth every morning. 30 capsule 5     I have reviewed patient's  Past Medical Hx, Surgical Hx, Family Hx, Social Hx, medications and allergies.  ROS:  Review of Systems  Constitutional:  Negative for fever.  Eyes:  Negative for blurred vision and photophobia.  Cardiovascular:  Negative for chest pain.  Neurological:  Positive for headaches.  Other systems negative     Physical Exam  Patient Vitals for the past 24 hrs:  BP Temp Temp src Pulse Resp SpO2 Height Weight  02/21/21 0541 (!) 176/97 98.8 F (37.1 C) -- -- (!) 24 -- -- --  02/21/21 0537 -- -- -- -- -- -- 5\' 6"  (1.676 m) 119.2 kg  02/21/21 0459 (!) 144/105 -- -- 95 16 96 % -- --  02/21/21 0457 -- 98.3 F (36.8 C) Oral -- -- -- -- --  02/21/21 0456 -- -- -- -- -- -- 5\' 6"  (1.676 m) 122 kg   Vitals:   02/21/21 0555 02/21/21 0615 02/21/21 0650 02/21/21 0652  BP: (!) 153/101 (!) 158/97 (!) 161/99 (!) 161/99  Pulse: 79 77 79   Resp:      Temp:      TempSrc:      SpO2:      Weight:      Height:        Constitutional: Well-developed, well-nourished female in no acute distress.  Cardiovascular: normal rate and rhythm, no ectopy audible, S1 & S2 heard, no murmur Respiratory: normal effort, no distress. Lungs CTAB with no wheezes or crackles GI: Abd soft, non-tender.  Nondistended.  No rebound, No guarding.  Bowel Sounds audible  MS: Extremities nontender, 1+ edema, normal ROM Neurologic: Alert and oriented x 4.   Grossly nonfocal.  DTRs 3+, no clonus GU: Neg CVAT. Skin:  Warm and Dry Psych:  Affect appropriate.   Labs: Results for orders placed or performed during the hospital encounter of 02/21/21 (from the past 24 hour(s))  CBC with Differential     Status: Abnormal   Collection Time: 02/21/21  5:11 AM  Result Value Ref Range   WBC 5.0 4.0 - 10.5 K/uL   RBC 3.64 (L) 3.87 - 5.11 MIL/uL   Hemoglobin 9.5 (L) 12.0 - 15.0 g/dL   HCT 02/23/21 (L) 02/23/21 - 54.0 %   MCV 87.1 80.0 - 100.0 fL   MCH 26.1 26.0 - 34.0 pg   MCHC 30.0 30.0 - 36.0 g/dL   RDW 08.6 (H) 76.1 - 95.0 %   Platelets  250 150 - 400 K/uL   nRBC 0.0 0.0 - 0.2 %   Neutrophils Relative % 70 %   Neutro Abs 3.4 1.7 - 7.7 K/uL   Lymphocytes Relative 19 %   Lymphs Abs 1.0 0.7 - 4.0 K/uL   Monocytes Relative 10 %   Monocytes Absolute 0.5 0.1 - 1.0  K/uL   Eosinophils Relative 0 %   Eosinophils Absolute 0.0 0.0 - 0.5 K/uL   Basophils Relative 0 %   Basophils Absolute 0.0 0.0 - 0.1 K/uL   Immature Granulocytes 1 %   Abs Immature Granulocytes 0.05 0.00 - 0.07 K/uL  Comprehensive metabolic panel     Status: Abnormal   Collection Time: 02/21/21  5:11 AM  Result Value Ref Range   Sodium 138 135 - 145 mmol/L   Potassium 3.2 (L) 3.5 - 5.1 mmol/L   Chloride 101 98 - 111 mmol/L   CO2 27 22 - 32 mmol/L   Glucose, Bld 95 70 - 99 mg/dL   BUN 10 6 - 20 mg/dL   Creatinine, Ser 6.38 (H) 0.44 - 1.00 mg/dL   Calcium 9.1 8.9 - 75.6 mg/dL   Total Protein 6.7 6.5 - 8.1 g/dL   Albumin 3.3 (L) 3.5 - 5.0 g/dL   AST 49 (H) 15 - 41 U/L   ALT 37 0 - 44 U/L   Alkaline Phosphatase 149 (H) 38 - 126 U/L   Total Bilirubin 2.0 (H) 0.3 - 1.2 mg/dL   GFR, Estimated >43 >32 mL/min   Anion gap 10 5 - 15  Troponin I (High Sensitivity)     Status: None   Collection Time: 02/21/21  5:11 AM  Result Value Ref Range   Troponin I (High Sensitivity) 15 <18 ng/L  Brain natriuretic peptide     Status: Abnormal   Collection Time: 02/21/21  5:12 AM  Result Value Ref Range   B Natriuretic Peptide 272.3 (H) 0.0 - 100.0 pg/mL  Urinalysis, Routine w reflex microscopic     Status: Abnormal   Collection Time: 02/21/21  6:04 AM  Result Value Ref Range   Color, Urine AMBER (A) YELLOW   APPearance CLEAR CLEAR   Specific Gravity, Urine 1.014 1.005 - 1.030   pH 7.0 5.0 - 8.0   Glucose, UA NEGATIVE NEGATIVE mg/dL   Hgb urine dipstick MODERATE (A) NEGATIVE   Bilirubin Urine NEGATIVE NEGATIVE   Ketones, ur NEGATIVE NEGATIVE mg/dL   Protein, ur 951 (A) NEGATIVE mg/dL   Nitrite NEGATIVE NEGATIVE   Leukocytes,Ua TRACE (A) NEGATIVE   RBC / HPF 6-10 0  - 5 RBC/hpf   WBC, UA 11-20 0 - 5 WBC/hpf   Bacteria, UA NONE SEEN NONE SEEN   Mucus PRESENT    Hyaline Casts, UA PRESENT   Protein / creatinine ratio, urine     Status: Abnormal   Collection Time: 02/21/21  6:04 AM  Result Value Ref Range   Creatinine, Urine 62.71 mg/dL   Total Protein, Urine 135 mg/dL   Protein Creatinine Ratio 2.15 (H) 0.00 - 0.15 mg/mg[Cre]     --/--/B POS Performed at [facility], [address], [place], [place] [address]  [phone])  Imaging:    MAU Course/MDM: I have ordered labs as follows: see above.  BNP is elevated.  Troponin normal  Creatinine elevated as is AST.  Protein Creatinine Ratio significantly elevated Imaging ordered: EKG Sinus Rhythm Results reviewed.   Consult Dr Debroah Loop Procardia 20mg  po given for hypertension.  .   Treatments in MAU included Zofran, Percocet (for HA)..    Assessment: PostOperative Day #16 Preeclampsia with severe features,  Hypertension with Headache Elevated BNP and Creatinine  Plan: Admit for observation  MD to follow.   CNM, MSN Certified Nurse-Midwife 02/21/2021 5:42 AM

## 2021-02-21 NOTE — Progress Notes (Signed)
FACULTY PRACTICE ANTEPARTUM COMPREHENSIVE PROGRESS NOTE  Debra Bell is a 30 y.o. [id number] s/p 1LTCS due to abruption and DIC who is admitted for postpartum PreE with severe range blood pressions and HA.    Length of Stay:  0 Days. Admitted 02/21/2021  Subjective: Since blood pressures have come down she feels much improved. Her HA is improved. She denies blurred vision. She is voiding since receiving the lasix.    Vitals:  Blood pressure 110/74, pulse 75, temperature 98.2 F (36.8 C), temperature source Oral, resp. rate 18, height 5\' 6"  (1.676 m), weight 119.2 kg, SpO2 99 %, unknown if currently breastfeeding.  Physical Examination: CONSTITUTIONAL: Well-developed, well-nourished female in no acute distress.  NEUROLOGIC: Alert and oriented to person, place, and time. No cranial nerve deficit noted. PSYCHIATRIC: Normal mood and affect. Normal behavior. Normal judgment and thought content. CARDIOVASCULAR: Normal heart rate noted, regular rhythm RESPIRATORY: Effort and breath sounds normal, no problems with respiration noted MUSCULOSKELETAL: Normal range of motion. No edema and no tenderness. 2+ distal pulses. ABDOMEN: Soft, nontender, nondistended, fundus firm   Results for orders placed or performed during the hospital encounter of 02/21/21 (from the past 48 hour(s))  CBC with Differential     Status: Abnormal   Collection Time: 02/21/21  5:11 AM  Result Value Ref Range   WBC 5.0 4.0 - 10.5 K/uL   RBC 3.64 (L) 3.87 - 5.11 MIL/uL   Hemoglobin 9.5 (L) 12.0 - 15.0 g/dL   HCT 02/23/21 (L) 43.1 - 54.0 %   MCV 87.1 80.0 - 100.0 fL   MCH 26.1 26.0 - 34.0 pg   MCHC 30.0 30.0 - 36.0 g/dL   RDW 08.6 (H) 76.1 - 95.0 %   Platelets 250 150 - 400 K/uL   nRBC 0.0 0.0 - 0.2 %   Neutrophils Relative % 70 %   Neutro Abs 3.4 1.7 - 7.7 K/uL   Lymphocytes Relative 19 %   Lymphs Abs 1.0 0.7 - 4.0 K/uL   Monocytes Relative 10 %   Monocytes Absolute 0.5 0.1 - 1.0 K/uL   Eosinophils Relative 0 %    Eosinophils Absolute 0.0 0.0 - 0.5 K/uL   Basophils Relative 0 %   Basophils Absolute 0.0 0.0 - 0.1 K/uL   Immature Granulocytes 1 %   Abs Immature Granulocytes 0.05 0.00 - 0.07 K/uL    Comment: Performed at [facility], [address] [place], [place]  Comprehensive metabolic panel     Status: Abnormal   Collection Time: 02/21/21  5:11 AM  Result Value Ref Range   Sodium 138 135 - 145 mmol/L   Potassium 3.2 (L) 3.5 - 5.1 mmol/L   Chloride 101 98 - 111 mmol/L   CO2 27 22 - 32 mmol/L   Glucose, Bld 95 70 - 99 mg/dL    Comment: Glucose reference range applies only to samples taken after fasting for at least 8 hours.   BUN 10 6 - 20 mg/dL   Creatinine, Ser 02/23/21 (H) 0.44 - 1.00 mg/dL   Calcium 9.1 8.9 - 5.80 mg/dL   Total Protein 6.7 6.5 - 8.1 g/dL   Albumin 3.3 (L) 3.5 - 5.0 g/dL   AST 49 (H) 15 - 41 U/L   ALT 37 0 - 44 U/L   Alkaline Phosphatase 149 (H) 38 - 126 U/L   Total Bilirubin 2.0 (H) 0.3 - 1.2 mg/dL   GFR, Estimated 99.8 >33 mL/min    Comment: (NOTE) Calculated using the  CKD-EPI Creatinine Equation (2021)    Anion gap 10 5 - 15    Comment: Performed at [facility], [address] [place], [place] [address]  Troponin I (High Sensitivity)     Status: None   Collection Time: 02/21/21  5:11 AM  Result Value Ref Range   Troponin I (High Sensitivity) 15 <18 ng/L    Comment: (NOTE) Elevated high sensitivity troponin I (hsTnI) values and significant  changes across serial measurements may suggest ACS but many other  chronic and acute conditions are known to elevate hsTnI results.  Refer to the "Links" section for chest pain algorithms and additional  guidance. Performed at [facility], [address], [place], [place] [address]   Brain natriuretic peptide     Status: Abnormal   Collection Time: 02/21/21  5:12 AM  Result Value Ref Range   B Natriuretic Peptide 272.3 (H) 0.0 - 100.0 pg/mL    Comment: Performed at [facility],  [address], [place], [place] [address]  Urinalysis, Routine w reflex microscopic     Status: Abnormal   Collection Time: 02/21/21  6:04 AM  Result Value Ref Range   Color, Urine AMBER (A) YELLOW    Comment: BIOCHEMICALS MAY BE AFFECTED BY COLOR   APPearance CLEAR CLEAR   Specific Gravity, Urine 1.014 1.005 - 1.030   pH 7.0 5.0 - 8.0   Glucose, UA NEGATIVE NEGATIVE mg/dL   Hgb urine dipstick MODERATE (A) NEGATIVE   Bilirubin Urine NEGATIVE NEGATIVE   Ketones, ur NEGATIVE NEGATIVE mg/dL   Protein, ur 710 (A) NEGATIVE mg/dL   Nitrite NEGATIVE NEGATIVE   Leukocytes,Ua TRACE (A) NEGATIVE   RBC / HPF 6-10 0 - 5 RBC/hpf   WBC, UA 11-20 0 - 5 WBC/hpf   Bacteria, UA NONE SEEN NONE SEEN   Mucus PRESENT    Hyaline Casts, UA PRESENT     Comment: Performed at [facility], [address], [place], [place] [address]  Protein / creatinine ratio, urine     Status: Abnormal   Collection Time: 02/21/21  6:04 AM  Result Value Ref Range   Creatinine, Urine 62.71 mg/dL   Total Protein, Urine 135 mg/dL    Comment: NO NORMAL RANGE ESTABLISHED FOR THIS TEST   Protein Creatinine Ratio 2.15 (H) 0.00 - 0.15 mg/mg[Cre]    Comment: Performed at [facility], [address] [place], [place] [address]  Troponin I (High Sensitivity)     Status: Abnormal   Collection Time: 02/21/21  7:17 AM  Result Value Ref Range   Troponin I (High Sensitivity) 23 (H) <18 ng/L    Comment: (NOTE) Elevated high sensitivity troponin I (hsTnI) values and significant  changes across serial measurements may suggest ACS but many other  chronic and acute conditions are known to elevate hsTnI results.  Refer to the "Links" section for chest pain algorithms and additional  guidance. Performed at [facility], [address], [place], [place] [address]   Resp Panel by RT-PCR (Flu A&B, Covid) Nasopharyngeal Swab     Status: None   Collection Time: 02/21/21  7:37 AM   Specimen: Nasopharyngeal Swab;  Nasopharyngeal(NP) swabs in vial transport medium  Result Value Ref Range   SARS Coronavirus 2 by RT PCR NEGATIVE NEGATIVE    Comment: (NOTE) SARS-CoV-2 target nucleic acids are NOT DETECTED.  The SARS-CoV-2 RNA is generally detectable in upper respiratory specimens during the acute phase of infection.  The lowest concentration of SARS-CoV-2 viral copies this assay can detect is 138 copies/mL. A negative result does not preclude SARS-Cov-2 infection and should not be used as the sole basis for treatment or other patient management decisions. A negative result may occur with  improper specimen collection/handling, submission of specimen other than nasopharyngeal swab, presence of viral mutation(s) within the areas targeted by this assay, and inadequate number of viral copies(<138 copies/mL). A negative result must be combined with clinical observations, patient history, and epidemiological information. The expected result is Negative.  Fact Sheet for Patients:  BloggerCourse.com  Fact Sheet for Healthcare Providers:  SeriousBroker.it  This test is no t yet approved or cleared by the [place] FDA and  has been authorized for detection and/or diagnosis of SARS-CoV-2 by FDA under an Emergency Use Authorization (EUA). This EUA will remain  in effect (meaning this test can be used) for the duration of the COVID-19 declaration under Section 564(b)(1) of the Act, 21 U.S.C.section 360bbb-3(b)(1), unless the authorization is terminated  or revoked sooner.       Influenza A by PCR NEGATIVE NEGATIVE   Influenza B by PCR NEGATIVE NEGATIVE    Comment: (NOTE) The Xpert Xpress SARS-CoV-2/FLU/RSV plus assay is intended as an aid in the diagnosis of influenza from Nasopharyngeal swab specimens and should not be used as a sole basis for treatment. Nasal washings and aspirates are unacceptable for Xpert Xpress  SARS-CoV-2/FLU/RSV testing.  Fact Sheet for Patients: BloggerCourse.com  Fact Sheet for Healthcare Providers: SeriousBroker.it  This test is not yet approved or cleared by the [place] FDA and has been authorized for detection and/or diagnosis of SARS-CoV-2 by FDA under an Emergency Use Authorization (EUA). This EUA will remain in effect (meaning this test can be used) for the duration of the COVID-19 declaration under Section 564(b)(1) of the Act, 21 U.S.C. section 360bbb-3(b)(1), unless the authorization is terminated or revoked.  Performed at [facility], [address], [place], [place] [address]     DG Chest 2 View  Result Date: 02/21/2021 CLINICAL DATA:  Bilateral foot swelling.  Two weeks postpartum. EXAM: CHEST - 2 VIEW COMPARISON:  Chest x-ray dated December 18, 2017. FINDINGS: The heart size and mediastinal contours are within normal limits. Normal pulmonary vascularity. Trace right pleural effusion. No consolidation or pneumothorax. No acute osseous abnormality. IMPRESSION: 1. Trace right pleural effusion. Electronically Signed   By: Obie Dredge M.D.   On: 02/21/2021 05:46    Current scheduled medications  prenatal multivitamin  1 tablet Oral Q1200    I have reviewed the patient's current medications.  ASSESSMENT: Active Problems:   Hypertension in pregnancy, preeclampsia, severe, delivered/postpartum   PLAN: Postpartum PreE with SF (HA and BP) - Reviewed diagnosis with the patient of postpartum PreE. Discussed Magnesium for 24 hours (started at 930am) and then monitoring of BP for some time following that. She has responded quickly to the Lasix and Procardia 20 mg. If needs BP medication would start Procardia XL but none routine ordered at this time.  - Cr slightly elevated at 1.06 - recheck tomorrow  Routine postpartum - Denies any needs at this time, minimal lochia, no complaints   Milas Hock,  MD, FACOG Obstetrician & Gynecologist, [facility], [facility]

## 2021-02-21 NOTE — ED Triage Notes (Signed)
Headache x 2days with elevated blood pressure and swelling to both feet. Pt is 2 weeks post partum, gave birth via CS 02/05/21.   Was told by OB gyne to have high BP.

## 2021-02-22 ENCOUNTER — Other Ambulatory Visit (HOSPITAL_COMMUNITY): Payer: Self-pay

## 2021-02-22 DIAGNOSIS — O1415 Severe pre-eclampsia, complicating the puerperium: Secondary | ICD-10-CM | POA: Diagnosis not present

## 2021-02-22 LAB — COMPREHENSIVE METABOLIC PANEL
ALT: 37 IU/L — ABNORMAL HIGH (ref 0–32)
ALT: 27 U/L (ref 0–44)
AST: 49 IU/L — ABNORMAL HIGH (ref 0–40)
AST: 32 U/L (ref 15–41)
Albumin/Globulin Ratio: 1.3 (ref 1.2–2.2)
Albumin: 3.7 g/dL — ABNORMAL LOW (ref 3.8–4.8)
Albumin: 3 g/dL — ABNORMAL LOW (ref 3.5–5.0)
Alkaline Phosphatase: 184 IU/L — ABNORMAL HIGH (ref 44–121)
Alkaline Phosphatase: 135 U/L — ABNORMAL HIGH (ref 38–126)
Anion gap: 8 (ref 5–15)
BUN/Creatinine Ratio: 12 (ref 9–23)
BUN: 10 mg/dL (ref 6–20)
BUN: 7 mg/dL (ref 6–20)
Bilirubin Total: 1.4 mg/dL — ABNORMAL HIGH (ref 0.0–1.2)
CO2: 20 mmol/L (ref 20–29)
CO2: 29 mmol/L (ref 22–32)
Calcium: 8.8 mg/dL (ref 8.7–10.2)
Calcium: 7.2 mg/dL — ABNORMAL LOW (ref 8.9–10.3)
Chloride: 104 mmol/L (ref 96–106)
Chloride: 96 mmol/L — ABNORMAL LOW (ref 98–111)
Creatinine, Ser: 0.81 mg/dL (ref 0.57–1.00)
Creatinine, Ser: 0.82 mg/dL (ref 0.44–1.00)
GFR, Estimated: >60 mL/min (ref >60)
Globulin, Total: 2.8 g/dL (ref 1.5–4.5)
Glucose, Bld: 112 mg/dL — ABNORMAL HIGH (ref 70–99)
Glucose: 81 mg/dL (ref 65–99)
Potassium: 3.7 mmol/L (ref 3.5–5.2)
Potassium: 3 mmol/L — ABNORMAL LOW (ref 3.5–5.1)
Sodium: 140 mmol/L (ref 134–144)
Sodium: 133 mmol/L — ABNORMAL LOW (ref 135–145)
Total Bilirubin: 1.7 mg/dL — ABNORMAL HIGH (ref 0.3–1.2)
Total Protein: 6.5 g/dL (ref 6.0–8.5)
Total Protein: 6.5 g/dL (ref 6.5–8.1)
eGFR: 99 mL/min/{1.73_m2} (ref >59)

## 2021-02-22 LAB — PROTEIN / CREATININE RATIO, URINE
Creatinine, Urine: 94.7 mg/dL
Protein, Ur: 92.6 mg/dL
Protein/Creat Ratio: 978 mg/g creat — ABNORMAL HIGH (ref 0–200)

## 2021-02-22 LAB — CBC
Hematocrit: 28.8 % — ABNORMAL LOW (ref 34.0–46.6)
Hemoglobin: 9.2 g/dL — ABNORMAL LOW (ref 11.1–15.9)
MCH: 26.4 pg — ABNORMAL LOW (ref 26.6–33.0)
MCHC: 31.9 g/dL (ref 31.5–35.7)
MCV: 83 fL (ref 79–97)
Platelets: 239 10*3/uL (ref 150–450)
RBC: 3.49 x10E6/uL — ABNORMAL LOW (ref 3.77–5.28)
RDW: 16.7 % — ABNORMAL HIGH (ref 11.7–15.4)
WBC: 4.7 10*3/uL (ref 3.4–10.8)

## 2021-02-22 MED ORDER — NIFEDIPINE ER 60 MG PO TB24
60.0000 mg | ORAL_TABLET | Freq: Every day | ORAL | 1 refills | Status: DC
  Filled 2021-02-22: qty 30, 30d supply, fill #0

## 2021-02-22 NOTE — Discharge Summary (Signed)
Postpartum Discharge Summary  Date of Service updated 02/22/21     Patient Name: Debra Bell DOB: [birth year] MRN: [id number]  Date of admission: 02/21/2021 Delivery date:02/05/2021  Delivering provider: Jaynie Collins A  Date of discharge: 02/22/2021  Admitting diagnosis: Hypertension in pregnancy, preeclampsia, severe, delivered/postpartum [O14.15] Intrauterine pregnancy: Unknown     Secondary diagnosis:  Active Problems:   Hypertension in pregnancy, preeclampsia, severe, delivered/postpartum  Additional problems: None    Discharge diagnosis: Preeclampsia (severe)                                              Post partum procedures: None Complications: Placental Abruption and Hemorrhage>101mL  Hospital course:  Debra Bell was admitted for elevated blood pressures and HA c/w postpartum preE with severe features. She was admitted through MAU and given Procardia 20 mg which quickly brought her blood pressures down. Her Cr was mildly elevated at 1.06. She was started on Magnesium which she had for 24 hours. She was started on Procardia XL 30 BID which controlled her blood pressures well. Her creatinine improved to 0.8.   Magnesium Sulfate received: Yes: Seizure prophylaxis  Physical exam  Vitals:   02/22/21 0603 02/22/21 0605 02/22/21 0700 02/22/21 0749  BP:    122/77  Pulse:    96  Resp:  17 16 16   Temp:    98.4 F (36.9 C)  TempSrc:    Oral  SpO2:    93%  Weight: 116.2 kg     Height:       General: alert, cooperative, and no distress Lochia: appropriate Uterine Fundus: firm Incision: N/A DVT Evaluation: No evidence of DVT seen on physical exam. No cords or calf tenderness. No significant calf/ankle edema. Labs: Lab Results  Component Value Date   WBC 5.0 02/21/2021   HGB 9.5 (L) 02/21/2021   HCT 31.7 (L) 02/21/2021   MCV 87.1 02/21/2021   PLT 250 02/21/2021   CMP Latest Ref Rng & Units 02/22/2021  Glucose 70 - 99 mg/dL 02/24/2021)  BUN 6 - 20 mg/dL 7   Creatinine 245(Y - 0.99 mg/dL 8.33  Sodium 8.25 - 053 mmol/L 133(L)  Potassium 3.5 - 5.1 mmol/L 3.0(L)  Chloride 98 - 111 mmol/L 96(L)  CO2 22 - 32 mmol/L 29  Calcium 8.9 - 10.3 mg/dL 7.2(L)  Total Protein 6.5 - 8.1 g/dL 6.5  Total Bilirubin 0.3 - 1.2 mg/dL 976)  Alkaline Phos 38 - 126 U/L 135(H)  AST 15 - 41 U/L 32  ALT 0 - 44 U/L 27   Edinburgh Score: Edinburgh Postnatal Depression Scale Screening Tool 02/20/2021  I have been able to laugh and see the funny side of things. 1  I have looked forward with enjoyment to things. 0  I have blamed myself unnecessarily when things went wrong. 0  I have been anxious or worried for no good reason. 0  I have felt scared or panicky for no good reason. 0  Things have been getting on top of me. 0  I have been so unhappy that I have had difficulty sleeping. 0  I have felt sad or miserable. 0  I have been so unhappy that I have been crying. 0  The thought of harming myself has occurred to me. 0  Edinburgh Postnatal Depression Scale Total 1      After visit meds:  Allergies  as of 02/22/2021   No Known Allergies      Medication List     STOP taking these medications    oxyCODONE 5 MG immediate release tablet Commonly known as: Oxy IR/ROXICODONE   triamterene-hydrochlorothiazide 37.5-25 MG capsule Commonly known as: Dyazide       TAKE these medications    ferrous sulfate 325 (65 FE) MG tablet Take 1 tablet (325 mg total) by mouth every other day.   ibuprofen 800 MG tablet Commonly known as: ADVIL Take 1 tablet (800 mg total) by mouth 3 (three) times daily with meals as needed for headache or moderate pain.   NIFEdipine 30 MG 24 hr tablet Commonly known as: ADALAT CC Take 1 tablet (30 mg total) by mouth 2 (two) times daily.   Prenate Pixie 10-0.6-0.4-200 MG Caps Take 1 capsule by mouth daily.   terbinafine 1 % cream Commonly known as: LAMISIL Apply 1 application topically 2 (two) times daily.         Discharge  home in stable condition Preeclampsia with severe features.  - Well controlled on Procardia - s/p Magnesium - Has f/u appt for 8/29 which she will keep - Discussed daily blood pressure logs and calling if elevated.    Discharge instruction: per After Visit Summary  Activity: Advance as tolerated. Pelvic rest for 6 weeks.  Diet: routine diet  Postpartum Appointment:1 week Additional Postpartum F/U: BP check 1 week on 8/29 Future Appointments: Future Appointments  Date Time Provider Department Center  03/05/2021  3:50 PM Constant, Gigi Gin, MD [facility] None   Follow up Visit:  Follow-up Information     [facility] Follow up.   Specialty: Obstetrics and Gynecology Why: Keep your appointment for 8/29 Continue daily blood pressure checks at home and call if elevated again. Contact information: [address], [address] [place] [address] [phone]                    02/22/2021 Milas Hock, MD

## 2021-03-05 ENCOUNTER — Emergency Department (HOSPITAL_COMMUNITY)
Admission: EM | Admit: 2021-03-05 | Discharge: 2021-03-05 | Disposition: A | Discharge status: Home / Self Care | Payer: Medicaid Other | Attending: Emergency Medicine | Admitting: Emergency Medicine

## 2021-03-05 ENCOUNTER — Encounter: Payer: Self-pay | Admitting: Obstetrics and Gynecology

## 2021-03-05 ENCOUNTER — Ambulatory Visit (INDEPENDENT_AMBULATORY_CARE_PROVIDER_SITE_OTHER): Payer: Medicaid Other | Admitting: Obstetrics and Gynecology

## 2021-03-05 ENCOUNTER — Other Ambulatory Visit: Payer: Self-pay

## 2021-03-05 ENCOUNTER — Emergency Department (HOSPITAL_COMMUNITY): Payer: Medicaid Other

## 2021-03-05 VITALS — BP 108/76 | HR 120 | Wt 256.0 lb

## 2021-03-05 DIAGNOSIS — R000 Tachycardia, unspecified: Secondary | ICD-10-CM | POA: Diagnosis not present

## 2021-03-05 DIAGNOSIS — I10 Essential (primary) hypertension: Secondary | ICD-10-CM | POA: Diagnosis not present

## 2021-03-05 DIAGNOSIS — R0789 Other chest pain: Secondary | ICD-10-CM | POA: Insufficient documentation

## 2021-03-05 DIAGNOSIS — I2693 Single subsegmental pulmonary embolism without acute cor pulmonale: Secondary | ICD-10-CM | POA: Diagnosis not present

## 2021-03-05 DIAGNOSIS — R0602 Shortness of breath: Secondary | ICD-10-CM | POA: Diagnosis not present

## 2021-03-05 DIAGNOSIS — I2699 Other pulmonary embolism without acute cor pulmonale: Secondary | ICD-10-CM | POA: Diagnosis not present

## 2021-03-05 DIAGNOSIS — Z20822 Contact with and (suspected) exposure to covid-19: Secondary | ICD-10-CM | POA: Diagnosis not present

## 2021-03-05 DIAGNOSIS — J45909 Unspecified asthma, uncomplicated: Secondary | ICD-10-CM | POA: Diagnosis not present

## 2021-03-05 DIAGNOSIS — Z0130 Encounter for examination of blood pressure without abnormal findings: Secondary | ICD-10-CM

## 2021-03-05 DIAGNOSIS — N9489 Other specified conditions associated with female genital organs and menstrual cycle: Secondary | ICD-10-CM | POA: Insufficient documentation

## 2021-03-05 DIAGNOSIS — R079 Chest pain, unspecified: Secondary | ICD-10-CM | POA: Diagnosis present

## 2021-03-05 DIAGNOSIS — R0781 Pleurodynia: Secondary | ICD-10-CM | POA: Diagnosis not present

## 2021-03-05 LAB — RESP PANEL BY RT-PCR (FLU A&B, COVID) ARPGX2
Influenza A by PCR: NEGATIVE
Influenza B by PCR: NEGATIVE
SARS Coronavirus 2 by RT PCR: NEGATIVE

## 2021-03-05 LAB — COMPREHENSIVE METABOLIC PANEL
ALT: 10 U/L (ref 0–44)
AST: 14 U/L — ABNORMAL LOW (ref 15–41)
Albumin: 3.7 g/dL (ref 3.5–5.0)
Alkaline Phosphatase: 99 U/L (ref 38–126)
Anion gap: 9 (ref 5–15)
BUN: <5 mg/dL — ABNORMAL LOW (ref 6–20)
CO2: 22 mmol/L (ref 22–32)
Calcium: 9.6 mg/dL (ref 8.9–10.3)
Chloride: 105 mmol/L (ref 98–111)
Creatinine, Ser: 0.78 mg/dL (ref 0.44–1.00)
GFR, Estimated: >60 mL/min (ref >60)
Glucose, Bld: 109 mg/dL — ABNORMAL HIGH (ref 70–99)
Potassium: 3.9 mmol/L (ref 3.5–5.1)
Sodium: 136 mmol/L (ref 135–145)
Total Bilirubin: 0.5 mg/dL (ref 0.3–1.2)
Total Protein: 7.8 g/dL (ref 6.5–8.1)

## 2021-03-05 LAB — CBC WITH DIFFERENTIAL/PLATELET
Abs Immature Granulocytes: 0.03 10*3/uL (ref 0.00–0.07)
Basophils Absolute: 0 10*3/uL (ref 0.0–0.1)
Basophils Relative: 0 %
Eosinophils Absolute: 0 10*3/uL (ref 0.0–0.5)
Eosinophils Relative: 0 %
HCT: 39.5 % (ref 36.0–46.0)
Hemoglobin: 11.1 g/dL — ABNORMAL LOW (ref 12.0–15.0)
Immature Granulocytes: 0 %
Lymphocytes Relative: 21 %
Lymphs Abs: 2.1 10*3/uL (ref 0.7–4.0)
MCH: 25.9 pg — ABNORMAL LOW (ref 26.0–34.0)
MCHC: 28.1 g/dL — ABNORMAL LOW (ref 30.0–36.0)
MCV: 92.3 fL (ref 80.0–100.0)
Monocytes Absolute: 0.8 10*3/uL (ref 0.1–1.0)
Monocytes Relative: 7 %
Neutro Abs: 7.3 10*3/uL (ref 1.7–7.7)
Neutrophils Relative %: 72 %
Platelets: 393 10*3/uL (ref 150–400)
RBC: 4.28 MIL/uL (ref 3.87–5.11)
RDW: 17.6 % — ABNORMAL HIGH (ref 11.5–15.5)
WBC: 10.3 10*3/uL (ref 4.0–10.5)
nRBC: 0 % (ref 0.0–0.2)

## 2021-03-05 LAB — D-DIMER, QUANTITATIVE: D-Dimer, Quant: 9.56 ug/mL-FEU — ABNORMAL HIGH (ref 0.00–0.50)

## 2021-03-05 MED ORDER — SODIUM CHLORIDE 0.9 % IV BOLUS
1000.0000 mL | Freq: Once | INTRAVENOUS | Status: DC
Start: 2021-03-05 — End: 2021-03-05

## 2021-03-05 MED ORDER — IOHEXOL 350 MG/ML SOLN
80.0000 mL | Freq: Once | INTRAVENOUS | Status: AC | PRN
  Administered 2021-03-05: 80 mL via INTRAVENOUS

## 2021-03-05 MED ORDER — APIXABAN (ELIQUIS) VTE STARTER PACK (10MG AND 5MG): ORAL_TABLET | ORAL | 0 refills | Status: DC

## 2021-03-05 MED ORDER — APIXABAN 5 MG PO TABS
10.0000 mg | ORAL_TABLET | Freq: Two times a day (BID) | ORAL | Status: DC
  Administered 2021-03-05: 10 mg via ORAL
  Filled 2021-03-05: qty 2

## 2021-03-05 MED ORDER — ACETAMINOPHEN 325 MG PO TABS
650.0000 mg | ORAL_TABLET | Freq: Once | ORAL | Status: AC
  Administered 2021-03-05: 650 mg via ORAL
  Filled 2021-03-05: qty 2

## 2021-03-05 MED ORDER — APIXABAN 5 MG PO TABS
5.0000 mg | ORAL_TABLET | Freq: Two times a day (BID) | ORAL | Status: DC
Start: 2021-03-12 — End: 2021-03-06

## 2021-03-05 NOTE — Discharge Instructions (Addendum)
--------------------------------------------------------------------------------   Information on my medicine - ELIQUIS (apixaban)  This medication education was reviewed with me or my healthcare representative as part of my discharge preparation.   Why was Eliquis prescribed for you? Eliquis was prescribed to treat blood clots that may have been found in the veins of your legs (deep vein thrombosis) or in your lungs (pulmonary embolism) and to reduce the risk of them occurring again.  What do You need to know about Eliquis ? The starting dose is 10 mg (two 5 mg tablets) taken TWICE daily for the FIRST SEVEN (7) DAYS, then on 03/12/21 evening, the dose is reduced to ONE 5 mg tablet taken TWICE daily.  Eliquis may be taken with or without food.   Try to take the dose about the same time in the morning and in the evening. If you have difficulty swallowing the tablet whole please discuss with your pharmacist how to take the medication safely.  Take Eliquis exactly as prescribed and DO NOT stop taking Eliquis without talking to the doctor who prescribed the medication.  Stopping may increase your risk of developing a new blood clot.  Refill your prescription before you run out.  After discharge, you should have regular check-up appointments with your healthcare provider that is prescribing your Eliquis.    What do you do if you miss a dose? If a dose of ELIQUIS is not taken at the scheduled time, take it as soon as possible on the same day and twice-daily administration should be resumed. The dose should not be doubled to make up for a missed dose.  Important Safety Information A possible side effect of Eliquis is bleeding. You should call your healthcare provider right away if you experience any of the following: Bleeding from an injury or your nose that does not stop. Unusual colored urine (red or dark brown) or unusual colored stools (red or black). Unusual bruising for unknown  reasons. A serious fall or if you hit your head (even if there is no bleeding).  Some medicines may interact with Eliquis and might increase your risk of bleeding or clotting while on Eliquis. To help avoid this, consult your healthcare provider or pharmacist prior to using any new prescription or non-prescription medications, including herbals, vitamins, non-steroidal anti-inflammatory drugs (NSAIDs) and supplements.  This website has more information on Eliquis (apixaban): http://www.eliquis.com/eliquis/home

## 2021-03-05 NOTE — ED Triage Notes (Signed)
Pt with chest pain only with inspiration and headache since waking up today. Went to PCP who gave good report but is here for second opinion since she is not feeling better. Took Tylenol without relief.

## 2021-03-05 NOTE — ED Provider Notes (Signed)
[facility] EMERGENCY DEPARTMENT Provider Note   CSN: [id number] Arrival date & time: 03/05/21  1806     History Chief Complaint  Patient presents with   Headache   Chest Pain    Debra Bell is a 30 y.o. female.  HPI  Patient presents with chest pain x1 day.  She says it started last night after drinking water but resolved independently.  She went to bed and woke up with chest pain midsternally but not well localized.  The pain is pleuritic, worse with inhalation and expiration.  There is no associated nausea or vomiting.  She also notes that she had swelling in her right leg about a week ago which has improved somewhat.  No recent surgeries, she does not smoke cigarettes, she has an IUD as birth control.  No previous history of blood clots.  Of note, patient had given birth August 1 or 2 to a son.  She did suffer through a placental abruption, but had a healthy child after the fact.  She has been struggling with hypertension over the last few weeks.    No known COVID exposures.  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, delivered 02/05/2021   DIC (disseminated intravascular coagulation) (HCC)    Hemorrhagic shock (HCC)    Iron deficiency anemia 12/29/2020   Previous cesarean delivery affecting pregnancy, antepartum 09/21/2020   Supervision of other normal pregnancy, antepartum 09/08/2020    Past Surgical History:  Procedure Laterality Date   CESAREAN SECTION     CESAREAN SECTION N/A 02/05/2021   Procedure: CESAREAN SECTION;  Surgeon: Tereso Newcomer, MD;  Location: [facility] LD [facility]S;  Service: Obstetrics;   Laterality: N/A;   OVARIAN CYST REMOVAL  2015   and 2013     OB History     Gravida  4   Para  3   Term  3   Preterm      AB  1   Living  3      SAB      IAB  1   Ectopic      Multiple  0   Live Births  3           No family history on file.  Social History   Tobacco Use   Smoking status: Never   Smokeless tobacco: Never  Vaping Use   Vaping Use: Never used  Substance Use Topics   Alcohol use: Never   Drug use: Never    Home Medications Prior to Admission medications   Medication Sig Start Date End Date Taking? Authorizing Provider  ferrous sulfate 325 (65 FE) MG tablet Take 1 tablet (325 mg total) by mouth every other day. 02/10/21   Hermina Staggers, MD  ibuprofen (ADVIL) 800 MG tablet Take 1 tablet (800 mg total) by mouth 3 (three) times daily with meals as needed for headache or moderate pain. 02/08/21   Hermina Staggers, MD  NIFEdipine (ADALAT CC) 60 MG 24 hr tablet Take 1 tablet (60 mg total) by mouth daily. 02/22/21   Milas Hock, MD  Prenat-FeAsp-Meth-FA-DHA w/o A (PRENATE PIXIE) 10-0.6-0.4-200 MG CAPS Take 1  capsule by mouth daily. 09/08/20   Conan Bowens, MD  terbinafine (LAMISIL) 1 % cream Apply 1 application topically 2 (two) times daily.    [provider]    Allergies    Patient has no known allergies.  Review of Systems   Review of Systems  Constitutional:  Positive for fever. Negative for chills.  HENT:  Negative for ear pain and sore throat.   Eyes:  Negative for pain and visual disturbance.  Respiratory:  Positive for chest tightness and shortness of breath. Negative for cough.   Cardiovascular:  Positive for chest pain and leg swelling. Negative for palpitations.  Gastrointestinal:  Negative for abdominal pain and vomiting.  Genitourinary:  Negative for dysuria and hematuria.  Musculoskeletal:  Negative for arthralgias and back pain.  Skin:  Negative for color change and rash.  Neurological:  Negative for seizures and  syncope.  All other systems reviewed and are negative.  Physical Exam Updated Vital Signs BP 108/90   Pulse (!) 116   Temp (!) 100.7 F (38.2 C)   Resp 14   SpO2 100%   Physical Exam Vitals and nursing note reviewed. Exam conducted with a chaperone present.  Constitutional:      Appearance: Normal appearance. She is obese.  HENT:     Head: Normocephalic and atraumatic.  Eyes:     General: No scleral icterus.       Right eye: No discharge.        Left eye: No discharge.     Extraocular Movements: Extraocular movements intact.     Pupils: Pupils are equal, round, and reactive to light.  Cardiovascular:     Rate and Rhythm: Regular rhythm. Tachycardia present.     Pulses: Normal pulses.     Heart sounds: Normal heart sounds. No murmur heard.   No friction rub. No gallop.  Pulmonary:     Effort: Pulmonary effort is normal. No respiratory distress.     Breath sounds: Normal breath sounds.  Abdominal:     General: Abdomen is flat. Bowel sounds are normal. There is no distension.     Palpations: Abdomen is soft.     Tenderness: There is no abdominal tenderness.  Musculoskeletal:        General: No swelling.  Skin:    General: Skin is warm and dry.     Coloration: Skin is not jaundiced.  Neurological:     Mental Status: She is alert. Mental status is at baseline.     Coordination: Coordination normal.   ED Results / Procedures / Treatments   Labs (all labs ordered are listed, but only abnormal results are displayed) Labs Reviewed  D-DIMER, QUANTITATIVE - Abnormal; Notable for the following components:      Result Value   D-Dimer, Quant 9.56 (*)    All other components within normal limits  CBC WITH DIFFERENTIAL/PLATELET - Abnormal; Notable for the following components:   Hemoglobin 11.1 (*)    MCH 25.9 (*)    MCHC 28.1 (*)    RDW 17.6 (*)    All other components within normal limits  COMPREHENSIVE METABOLIC PANEL - Abnormal; Notable for the following components:    Glucose, Bld 109 (*)    BUN <5 (*)    AST 14 (*)    All other components within normal limits  RESP PANEL BY RT-PCR (FLU A&B, COVID) ARPGX2  I-STAT BETA HCG BLOOD, ED ([facility], [facility], [facility] ONLY)    EKG None  Radiology No results  found.  Procedures Procedures   Medications Ordered in ED Medications  acetaminophen (TYLENOL) tablet 650 mg (650 mg Oral Given 03/05/21 2012)    ED Course  I have reviewed the triage vital signs and the nursing notes.  Pertinent labs & imaging results that were available during my care of the patient were reviewed by me and considered in my medical decision making (see chart for details).  Clinical Course as of 03/05/21 2310  Mon Mar 05, 2021  2015 D-Dimer, Quant(!): 9.56 We will order a CTA to to evaluate for PE. [HS]  2016 CBC with Differential(!) No leukocytosis, mild anemia. [HS]  2016 Comprehensive metabolic panel(!) [HS]  2309 Resp(!): 27 Incorrect - 18 at bedside [HS]    Clinical Course User Index [HS] Theron Arista, PA-C   MDM Rules/Calculators/A&P                           Patient is febrile and tachycardic, question of COVID versus PE versus other etiology.  Patient just gave birth in August, no recent surgeries or other risk factors.  She has an IUD in place as birth control.  Given that she is recently given birth, this puts her at higher risk for PE.  Work-up initiated.  D-dimer came back elevated, CTA ordered to assess for PE.  COVID test is also negative.  I suspect the fever is compensatory to the PE.  CTA revealing for PE.  We will put in consult for starting the patient on Eliquis.  I discussed the results with the patient, her PESI score is 51, making her class I very low risk for mortality PE I think she is appropriate for outpatient management, patient agreed.  We discussed risks and she would prefer to treat herself outpatient with close follow-up.  We also discussed briefly anticoagulation medicine as well as how to take it  appropriately.  We will also give the patient additional reading material.  Patient vitals are stable, she is mildly tachycardic at 104 and respiratory rate is 18.  She is somewhat febrile as well.  However, patient is appropriate for discharge at this time.  She is spoke with hospitalist and will take the Eliquis as prescribed.  Return precautions discussed and agreed upon.  Final Clinical Impression(s) / ED Diagnoses Final diagnoses:  None    Rx / DC Orders ED Discharge Orders     None        Theron Arista, Cordelia Poche 03/05/21 2311    Pricilla Loveless, MD 03/06/21 [phone]

## 2021-03-05 NOTE — Progress Notes (Signed)
Post Partum Visit Note  Debra Bell is a 30 y.o. [phone] female who presents for a postpartum visit. She is 4 weeks postpartum following a repeat cesarean section.  I have fully reviewed the prenatal and intrapartum course. The delivery was at 37.5 gestational weeks.  Anesthesia: spinal. Postpartum course has been complicated by recent hospital admission for postpartum preeclampsia. Baby is doing well. Baby is feeding by bottle Rush Barer . Bleeding red. Bowel function is normal. Bladder function is normal. Patient is not sexually active. Contraception method is IUD. Postpartum depression screening: pt frustrated that she has been dealing with headaches, elevated b/p etc .Depression screening is Negative EPDS = 3    The pregnancy intention screening data noted above was reviewed. Potential methods of contraception were discussed. The patient elected to proceed with No data recorded.   Edinburgh Postnatal Depression Scale - 03/05/21 1601       Edinburgh Postnatal Depression Scale:  In the Past 7 Days   I have been able to laugh and see the funny side of things. 0    I have looked forward with enjoyment to things. 0    I have blamed myself unnecessarily when things went wrong. 0    I have been anxious or worried for no good reason. 2    I have felt scared or panicky for no good reason. 0    Things have been getting on top of me. 1    I have been so unhappy that I have had difficulty sleeping. 0    I have felt sad or miserable. 0    I have been so unhappy that I have been crying. 0    The thought of harming myself has occurred to me. 0    Edinburgh Postnatal Depression Scale Total 3             Health Maintenance Due  Topic Date Due   COVID-19 Vaccine (1) Never done   INFLUENZA VACCINE  02/05/2021       Review of Systems Pertinent items noted in HPI and remainder of comprehensive ROS otherwise negative.  Objective:  BP 108/76   Pulse (!) 120   Wt 256 lb (116.1 kg)    Breastfeeding No   BMI 41.32 kg/m    General:  alert, cooperative, and no distress   Breasts:  normal  Lungs: clear to auscultation bilaterally  Heart:  regular rate and rhythm  Abdomen: soft, non-tender; bowel sounds normal; no masses,  no organomegaly   Wound well approximated incision, no erythema, induration or drainage  GU exam:   Patient declined as she is started her menses. Patient plans to return for IUD check       Assessment:    There are no diagnoses linked to this encounter.  Normal postpartum exam.   Plan:   Essential components of care per ACOG recommendations:  1.  Mood and well being: Patient with negative depression screening today. Reviewed local resources for support.  - Patient tobacco use? No.   - hx of drug use? No.    2. Infant care and feeding:  -Patient currently breastmilk feeding? No.  -Social determinants of health (SDOH) reviewed in EPIC. No concerns  3. Sexuality, contraception and birth spacing - Patient does not want a pregnancy in the next year.   - Reviewed forms of contraception in tiered fashion. Patient had IUD placed at time of c-section.   - Patient will return for IUD check - Discussed  birth spacing of 18 months  4. Sleep and fatigue -Encouraged family/partner/community support of 4 hrs of uninterrupted sleep to help with mood and fatigue  5. Physical Recovery  - Discussed patients delivery and complications.  - Patient has urinary incontinence? No. - Patient is safe to resume physical and sexual activity  6.  Health Maintenance - HM due items addressed Yes - Last pap smear  Diagnosis  Date Value Ref Range Status  09/12/2020   Final   - Negative for intraepithelial lesion or malignancy (NILM)   Pap smear not done at today's visit.  -Breast Cancer screening indicated? No.   7. Chronic Disease/Pregnancy Condition follow up: Hypertension- continue procardia for 2 more weeks. Recheck BP and contact office with reading.  Parameters provided  - PCP follow up  Catalina Antigua, MD [facility], [facility]

## 2021-03-05 NOTE — Progress Notes (Deleted)
30 yo here for BP check following a recent diagnosis of postpartum pre--eclampsia last week. Patient reports feeling better and denies HA, visual changes, RUQ/epigastric pain, nausea or emesis  Past Medical History:  Diagnosis Date   Asthma    Bilateral ovarian cysts    History of ovarian cyst    Past Surgical History:  Procedure Laterality Date   CESAREAN SECTION     CESAREAN SECTION N/A 02/05/2021   Procedure: CESAREAN SECTION;  Surgeon: Tereso Newcomer, MD;  Location: [facility] LD ORS;  Service: Obstetrics;  Laterality: N/A;   OVARIAN CYST REMOVAL  2015   and 2013   No family history on file. Social History   Tobacco Use   Smoking status: Never   Smokeless tobacco: Never  Vaping Use   Vaping Use: Never used  Substance Use Topics   Alcohol use: Never   Drug use: Never   ROS See pertinent in HPI. All other systems reviewed and non contributory .*** GENERAL: Well-developed, well-nourished female in no acute distress.  ABDOMEN: Soft, nontender, nondistended. No organomegaly. Incision: no erythema, induration or drainage. Incision healing well EXTREMITIES: No cyanosis, clubbing, or edema, 2+ distal pulses.  A/P 30 yo here for BP check secondary to postpartum preeclampsia - Continue procardia 30 daily - Follow up as scheduled for postpartum visit - Wound care instructions reviewed

## 2021-03-05 NOTE — ED Provider Notes (Signed)
Emergency Medicine Provider Triage Evaluation Note  Debra Bell , a 30 y.o. female  was evaluated in triage.  Pt complains of pleuritic cp and increased work of breathing. No long travel, did notice leg swelling 1 week ago. Also complains of headache  Review of Systems  Positive: CP, SOB, leg swelling  Negative: Hemoptysis, past dvt  Physical Exam  BP 114/72 (BP Location: Right Arm)   Pulse (!) 134   Temp (!) 100.8 F (38.2 C) (Oral)   Resp 15   SpO2 100%  Gen:   Awake, no distress   Resp:  Normal effort, CTA MSK:   Moves extremities without difficulty  Other:  Tachy   Medical Decision Making  Medically screening exam initiated at 6:21 PM.  Appropriate orders placed.  Debra Bell was informed that the remainder of the evaluation will be completed by another provider, this initial triage assessment does not replace that evaluation, and the importance of remaining in the ED until their evaluation is complete.  Covid vs pe   Debra Bell 03/05/21 Debra Kohut, MD 03/06/21 [phone]

## 2021-03-06 ENCOUNTER — Other Ambulatory Visit: Payer: Self-pay

## 2021-03-06 ENCOUNTER — Other Ambulatory Visit (HOSPITAL_COMMUNITY): Payer: Self-pay

## 2021-03-06 ENCOUNTER — Emergency Department (HOSPITAL_COMMUNITY)
Admission: EM | Admit: 2021-03-06 | Discharge: 2021-03-06 | Disposition: A | Discharge status: Home / Self Care | Payer: Medicaid Other | Attending: Emergency Medicine | Admitting: Emergency Medicine

## 2021-03-06 ENCOUNTER — Encounter (HOSPITAL_COMMUNITY): Payer: Self-pay | Admitting: Emergency Medicine

## 2021-03-06 DIAGNOSIS — I2693 Single subsegmental pulmonary embolism without acute cor pulmonale: Secondary | ICD-10-CM | POA: Diagnosis not present

## 2021-03-06 DIAGNOSIS — R0781 Pleurodynia: Secondary | ICD-10-CM

## 2021-03-06 DIAGNOSIS — I2699 Other pulmonary embolism without acute cor pulmonale: Secondary | ICD-10-CM | POA: Diagnosis not present

## 2021-03-06 DIAGNOSIS — Z7901 Long term (current) use of anticoagulants: Secondary | ICD-10-CM | POA: Diagnosis not present

## 2021-03-06 DIAGNOSIS — O909 Complication of the puerperium, unspecified: Secondary | ICD-10-CM | POA: Insufficient documentation

## 2021-03-06 DIAGNOSIS — J45909 Unspecified asthma, uncomplicated: Secondary | ICD-10-CM | POA: Diagnosis not present

## 2021-03-06 DIAGNOSIS — R000 Tachycardia, unspecified: Secondary | ICD-10-CM | POA: Diagnosis not present

## 2021-03-06 LAB — CBC
HCT: 35.5 % — ABNORMAL LOW (ref 36.0–46.0)
Hemoglobin: 10.7 g/dL — ABNORMAL LOW (ref 12.0–15.0)
MCH: 26.6 pg (ref 26.0–34.0)
MCHC: 30.1 g/dL (ref 30.0–36.0)
MCV: 88.3 fL (ref 80.0–100.0)
Platelets: 342 10*3/uL (ref 150–400)
RBC: 4.02 MIL/uL (ref 3.87–5.11)
RDW: 17.5 % — ABNORMAL HIGH (ref 11.5–15.5)
WBC: 9.8 10*3/uL (ref 4.0–10.5)
nRBC: 0 % (ref 0.0–0.2)

## 2021-03-06 LAB — BASIC METABOLIC PANEL
Anion gap: 9 (ref 5–15)
BUN: 5 mg/dL — ABNORMAL LOW (ref 6–20)
CO2: 25 mmol/L (ref 22–32)
Calcium: 9.3 mg/dL (ref 8.9–10.3)
Chloride: 102 mmol/L (ref 98–111)
Creatinine, Ser: 0.73 mg/dL (ref 0.44–1.00)
GFR, Estimated: >60 mL/min (ref >60)
Glucose, Bld: 117 mg/dL — ABNORMAL HIGH (ref 70–99)
Potassium: 4 mmol/L (ref 3.5–5.1)
Sodium: 136 mmol/L (ref 135–145)

## 2021-03-06 LAB — TROPONIN I (HIGH SENSITIVITY)
Troponin I (High Sensitivity): 3 ng/L (ref <18)
Troponin I (High Sensitivity): 2 ng/L (ref <18)

## 2021-03-06 MED ORDER — KETOROLAC TROMETHAMINE 60 MG/2ML IM SOLN
30.0000 mg | Freq: Once | INTRAMUSCULAR | Status: AC
  Administered 2021-03-06: 30 mg via INTRAMUSCULAR
  Filled 2021-03-06: qty 2

## 2021-03-06 MED ORDER — KETOROLAC TROMETHAMINE 30 MG/ML IJ SOLN: 30.0000 mg | Freq: Once | INTRAMUSCULAR | Status: DC

## 2021-03-06 MED ORDER — IBUPROFEN 800 MG PO TABS: 800.0000 mg | ORAL_TABLET | Freq: Three times a day (TID) | ORAL | 3 refills | Status: DC | PRN

## 2021-03-06 MED ORDER — APIXABAN (ELIQUIS) VTE STARTER PACK (10MG AND 5MG)
ORAL_TABLET | ORAL | 0 refills | Status: DC
  Filled 2021-03-06: qty 74, 30d supply, fill #0

## 2021-03-06 MED ORDER — APIXABAN 5 MG PO TABS
10.0000 mg | ORAL_TABLET | Freq: Once | ORAL | Status: AC
  Administered 2021-03-06: 10 mg via ORAL
  Filled 2021-03-06: qty 2

## 2021-03-06 NOTE — ED Provider Notes (Signed)
Emergency Medicine Provider Triage Evaluation Note  Debra Bell , a 30 y.o. female  was evaluated in triage.  Pt complains of chest pain. States that she was diagnosed with PE yesterday.  States that she was discharged on Eliquis.  Reports worsening chest pain and SOB now.  Review of Systems  Positive: Chest pain, SOB Negative: Fevers, chills  Physical Exam  BP 121/80   Pulse (!) 115   Temp 98.6 F (37 C) (Oral)   Resp 17   SpO2 100%  Gen:   Awake, no distress   Resp:  Normal effort  MSK:   Moves extremities without difficulty  Other:    Medical Decision Making  Medically screening exam initiated at 5:51 AM.  Appropriate orders placed.  [name] was informed that the remainder of the evaluation will be completed by another provider, this initial triage assessment does not replace that evaluation, and the importance of remaining in the ED until their evaluation is complete.  Chest pain   Roxy Horseman, PA-C 03/06/21 [id number]    Shon Baton, MD 03/06/21 [phone]

## 2021-03-06 NOTE — ED Triage Notes (Signed)
Patient reports right upper chest pain worse with deep inspiration onset 2 am this morning , seen here last night discharged home /diagnosed with pulmonary embolism .

## 2021-03-06 NOTE — ED Provider Notes (Signed)
[facility] EMERGENCY DEPARTMENT Provider Note  CSN: [id number] Arrival date & time: 03/06/21 0535    History Chief Complaint  Patient presents with   Chest Pain    Debra Bell is a 30 y.o. female who is about 1 month post-partum from a c-section delivery. Post-partum course was complicated by HTN/pre-eclampsia. She was started on Procardia with good improvement. She was seen in the ED yesterday evening for pleuritic chest pain and tachycardia. Found to have a small segmental PE without right heart strain or elevated Trop. She was given a dose of Eliquis and Rx for same. She reports pain was persistent through the night, worse with deep breath, and so she returned to the ED this morning. No fever.    Past Medical History:  Diagnosis Date   Asthma    Bilateral ovarian cysts    History of ovarian cyst     Past Surgical History:  Procedure Laterality Date   CESAREAN SECTION     CESAREAN SECTION N/A 02/05/2021   Procedure: CESAREAN SECTION;  Surgeon: Tereso Newcomer, MD;  Location: [facility] LD ORS;  Service: Obstetrics;  Laterality: N/A;   OVARIAN CYST REMOVAL  2015   and 2013    No family history on file.  Social History   Tobacco Use   Smoking status: Never   Smokeless tobacco: Never  Vaping Use   Vaping Use: Never used  Substance Use Topics   Alcohol use: Never   Drug use: Never     Home Medications Prior to Admission medications   Medication Sig Start Date End Date Taking? Authorizing Provider  APIXABAN Everlene Balls) VTE STARTER PACK (10MG  AND 5MG ) Take as directed on package: start with two-5mg  tablets twice daily for 7 days. On day 8, switch to one-5mg  tablet twice daily. 03/06/21   , MD  ferrous sulfate 325 (65 FE) MG tablet Take 1 tablet (325 mg total) by mouth every other day. 02/10/21   Pollyann Savoy, MD  ibuprofen (ADVIL) 800 MG tablet Take 1 tablet (800 mg total) by mouth 3 (three) times daily with meals as needed for headache or moderate  pain. 03/06/21   Hermina Staggers, MD  NIFEdipine (ADALAT CC) 60 MG 24 hr tablet Take 1 tablet (60 mg total) by mouth daily. 02/22/21   Pollyann Savoy, MD  Prenat-FeAsp-Meth-FA-DHA w/o A (PRENATE PIXIE) 10-0.6-0.4-200 MG CAPS Take 1 capsule by mouth daily. 09/08/20   10-16-1987, MD  terbinafine (LAMISIL) 1 % cream Apply 1 application topically 2 (two) times daily.    [provider]     Allergies    Patient has no known allergies.   Review of Systems   Review of Systems A comprehensive review of systems was completed and negative except as noted in HPI.    Physical Exam BP (!) 130/93   Pulse (!) 104   Temp 99.1 F (37.3 C) (Oral)   Resp (!) 31   SpO2 100%   Physical Exam Vitals and nursing note reviewed.  Constitutional:      Appearance: Normal appearance.  HENT:     Head: Normocephalic and atraumatic.     Nose: Nose normal.     Mouth/Throat:     Mouth: Mucous membranes are moist.  Eyes:     Extraocular Movements: Extraocular movements intact.     Conjunctiva/sclera: Conjunctivae normal.  Cardiovascular:     Rate and Rhythm: Tachycardia present.  Pulmonary:     Effort: Pulmonary effort is normal.  Breath sounds: Normal breath sounds.  Chest:     Chest wall: Tenderness (R upper chest, reproduces pain) present.  Abdominal:     General: Abdomen is flat.     Palpations: Abdomen is soft.     Tenderness: There is no abdominal tenderness.  Musculoskeletal:        General: No swelling. Normal range of motion.     Cervical back: Neck supple.  Skin:    General: Skin is warm and dry.  Neurological:     General: No focal deficit present.     Mental Status: She is alert.  Psychiatric:        Mood and Affect: Mood normal.     ED Results / Procedures / Treatments   Labs (all labs ordered are listed, but only abnormal results are displayed) Labs Reviewed  BASIC METABOLIC PANEL - Abnormal; Notable for the following components:      Result Value   Glucose,  Bld 117 (*)    BUN 5 (*)    All other components within normal limits  CBC - Abnormal; Notable for the following components:   Hemoglobin 10.7 (*)    HCT 35.5 (*)    RDW 17.5 (*)    All other components within normal limits  TROPONIN I (HIGH SENSITIVITY)  TROPONIN I (HIGH SENSITIVITY)    EKG EKG Interpretation  Date/Time:  Tuesday March 06 2021 05:45:03 EDT Ventricular Rate:  109 PR Interval:  126 QRS Duration: 74 QT Interval:  330 QTC Calculation: 444 R Axis:   26 Text Interpretation: Sinus tachycardia Otherwise normal ECG Since last tracing Rate slower Confirmed by Susy Frizzle [id number]) on 03/06/2021 8:29:38 AM  Radiology CT Angio Chest PE W/Cm &/Or Wo Cm  Result Date: 03/05/2021 CLINICAL DATA:  Shortness of breath.  Postpartum. EXAM: CT ANGIOGRAPHY CHEST WITH CONTRAST TECHNIQUE: Multidetector CT imaging of the chest was performed using the standard protocol during bolus administration of intravenous contrast. Multiplanar CT image reconstructions and MIPs were obtained to evaluate the vascular anatomy. CONTRAST:  67mL OMNIPAQUE IOHEXOL 350 MG/ML SOLN COMPARISON:  None. FINDINGS: Cardiovascular: Contrast injection is sufficient to demonstrate satisfactory opacification of the pulmonary arteries to the segmental level. There are small filling defects within the posterior right lower lobe segmental arteries. The size of the main pulmonary artery is normal. Heart size is normal, with no pericardial effusion. The course and caliber of the aorta are normal. There is no atherosclerotic calcification. Opacification decreased due to pulmonary arterial phase contrast bolus timing. Mediastinum/Nodes: No mediastinal, hilar or axillary lymphadenopathy. Normal visualized thyroid. Thoracic esophageal course is normal. Lungs/Pleura: Airways are patent. No pleural effusion, lobar consolidation, pneumothorax or pulmonary infarction. Upper Abdomen: Contrast bolus timing is not optimized for evaluation  of the abdominal organs. The visualized portions of the organs of the upper abdomen are normal. Musculoskeletal: No chest wall abnormality. No bony spinal canal stenosis. Review of the MIP images confirms the above findings. IMPRESSION: 1. Small pulmonary emboli within the posterior right lower lobe segmental arteries. 2. No evidence of right heart strain. Critical Value/emergent results were called by telephone at the time of interpretation on 03/05/2021 at 10:08 pm to provider HALEY SAGE , who verbally acknowledged these results. Electronically Signed   By: Deatra Robinson M.D.   On: 03/05/2021 22:08    Procedures Procedures  Medications Ordered in the ED Medications  apixaban (ELIQUIS) tablet 10 mg (10 mg Oral Given 03/06/21 0900)  ketorolac (TORADOL) injection 30 mg (30 mg Intramuscular Given 03/06/21  [facility])     MDM Rules/Calculators/A&P MDM  Patient with known PE seen last night here for continued pain. She understands that it will take some time for the clot to go away and is more concerned with adequate pain control. Labs ordered in triage this morning are unremarkable, awaiting second Trop. Toradol for pain control, morning dose of Eliquis. She will pick up her Rx when she leaves here.  ED Course  I have reviewed the triage vital signs and the nursing notes.  Pertinent labs & imaging results that were available during my care of the patient were reviewed by me and considered in my medical decision making (see chart for details).  Clinical Course as of 03/06/21 1051  Tue Mar 06, 2021  1036 Second trop remains normal. Pharmacy has been able to get the patient's initial Eliquis Rx filled here for her to take at home. She does not have any signs of significant cardiac effects of her small PE. Will need some pain medications to help with her pleuritic pain and PCP follow up.  [CS]    Clinical Course User Index [CS] Pollyann Savoy, MD    Final Clinical Impression(s) / ED Diagnoses Final  diagnoses:  Pleuritic chest pain  Single subsegmental pulmonary embolism without acute cor pulmonale (HCC)    Rx / DC Orders ED Discharge Orders          Ordered    APIXABAN (ELIQUIS) VTE STARTER PACK (10MG  AND 5MG )        03/06/21 0915    ibuprofen (ADVIL) 800 MG tablet  3 times daily with meals PRN        03/06/21 1051             03/08/21, MD 03/06/21 1051

## 2021-03-06 NOTE — ED Notes (Signed)
Patient reported increasing chest pain and shortness of breath, RN aware.

## 2021-03-08 ENCOUNTER — Encounter: Payer: Self-pay | Admitting: Nurse Practitioner

## 2021-03-08 ENCOUNTER — Other Ambulatory Visit: Payer: Self-pay

## 2021-03-08 ENCOUNTER — Telehealth (INDEPENDENT_AMBULATORY_CARE_PROVIDER_SITE_OTHER): Payer: Medicaid Other | Admitting: Nurse Practitioner

## 2021-03-08 DIAGNOSIS — I2699 Other pulmonary embolism without acute cor pulmonale: Secondary | ICD-10-CM | POA: Diagnosis not present

## 2021-03-08 DIAGNOSIS — Z419 Encounter for procedure for purposes other than remedying health state, unspecified: Secondary | ICD-10-CM | POA: Diagnosis not present

## 2021-03-08 MED ORDER — IBUPROFEN 800 MG PO TABS: 800.0000 mg | ORAL_TABLET | Freq: Three times a day (TID) | ORAL | 3 refills | Status: DC | PRN

## 2021-03-08 NOTE — Patient Instructions (Addendum)
Pulmonary Embolism:  Continue Eliquis  Will resend prescription for Ibuprofen per ED order  Will schedule appointment to establish care with a new PCP.  Follow up:  Follow up as scheduled - please go to the ED if symptoms worsen  Pulmonary Embolism A pulmonary embolism (PE) is a sudden blockage or decrease of blood flow in one or both lungs that happens when a clot travels into the arteries of the lung (pulmonary arteries). Most blockages come from a blood clot that forms in the vein of a leg or arm (deep vein thrombosis, DVT) and travels to the lungs. A clot is blood that has thickened into a gel or solid. PE is a dangerous and life-threatening condition that needs to be treated right away. What are the causes? This condition is usually caused by a blood clot that forms in a vein and moves to the lungs. In rare cases, it may be caused by air, fat, part of a tumor, or other tissue that moves through the veins and into the lungs. What increases the risk? The following factors may make you more likely to develop this condition: Experiencing a traumatic injury, such as breaking a hip or leg. Having: A spinal cord injury. Major surgery, especially hip or knee replacement, or surgery on parts of the nervous system or on the abdomen. A stroke. A blood-clotting disease. Long-term (chronic) lung or heart disease. Cancer, especially if you are being treated with chemotherapy. A central venous catheter. Taking medicines that contain estrogen. These include birth control pills and hormone replacement therapy. Being: Pregnant. In the period of time after your baby is delivered (postpartum). Older than age 84. Overweight. A smoker, especially if you have other risks. Not very active (sedentary), not being able to move at all, or spending long periods sitting, such as travel over 6 hours. You are also at a greater risk if you have a leg in a cast or splint. What are the signs or  symptoms? Symptoms of this condition usually start suddenly and include: Shortness of breath during activity or at rest. Coughing, coughing up blood, or coughing up bloody mucus. Chest pain, back pain, or shoulder blade pain that gets worse with deep breaths. Rapid or irregular heartbeat. Feeling light-headed or dizzy, or fainting. Feeling anxious. Pain and swelling in a leg. This is a symptom of DVT, which can lead to PE. How is this diagnosed? This condition may be diagnosed based on your medical history, a physical exam, and tests. Tests may include: Blood tests. An ECG (electrocardiogram) of the heart. A CT pulmonary angiogram. This test checks blood flow in and around your lungs. A ventilation-perfusion scan, also called a lung VQ scan. This test measures air flow and blood flow to the lungs. An ultrasound to check for a DVT. How is this treated? Treatment for this condition depends on many factors, such as the cause of your PE, your risk for bleeding or developing more clots, and other medical conditions you may have. Treatment aims to stop blood clots from forming or growing larger. In some cases, treatment may be aimed at breaking apart or removing the blood clot. Treatment may include: Medicines, such as: Blood thinning medicines, also called anticoagulants, to stop clots from forming and growing. Medicines that break apart clots (fibrinolytics). Procedures, such as: Using a flexible tube to remove a blood clot (embolectomy) or to deliver medicine to destroy it (catheter-directed thrombolysis). Surgery to remove the clot (surgical embolectomy). This is rare. You may need a  combination of immediate, long-term, and extended treatments. Your treatment may continue for several months (maintenance therapy) or longer depending on your medical conditions. You and your health care provider will work together to choose the treatment program that is best for you. Follow these instructions at  home: Medicines Take over-the-counter and prescription medicines only as told by your health care provider. If you are taking blood thinners: Talk with your health care provider before you take any medicines that contain aspirin or NSAIDs, such as ibuprofen. These medicines increase your risk for dangerous bleeding. Take your medicine exactly as told, at the same time every day. Avoid activities that could cause injury or bruising, and follow instructions about how to prevent falls. Wear a medical alert bracelet or carry a card that lists what medicines you take. Understand what foods and drugs interact with any medicines that you are taking. General instructions Ask your health care provider when you may return to your normal activities. Avoid sitting or lying for a long time without moving. Maintain a healthy weight. Ask your health care provider what weight is healthy for you. Do not use any products that contain nicotine or tobacco. These products include cigarettes, chewing tobacco, and vaping devices, such as e-cigarettes. If you need help quitting, ask your health care provider. Talk with your health care provider about any travel plans. It is important to make sure that you are still able to take your medicine while traveling. Keep all follow-up visits. This is important. Where to find more information American Lung Association: www.lung.org Centers for Disease Control and Prevention: [employer].br Contact a health care provider if: You missed a dose of your blood thinner medicine. You have a fever. Get help right away if: You have: New or increased pain, swelling, warmth, or redness in an arm or leg. Shortness of breath that gets worse during activity or at rest. Worsening chest pain. A rapid or irregular heartbeat. A severe headache. Vision changes. A serious fall or accident, or you hit your head. Blood in your vomit, stool, or urine. A cut that will not stop bleeding. You  cough up blood. You feel light-headed or dizzy, and that feeling does not go away. You cannot move your arms or legs. You are confused or have memory loss. These symptoms may represent a serious problem that is an emergency. Do not wait to see if the symptoms will go away. Get medical help right away. Call your local emergency services (911 in the [place] Do not drive yourself to the hospital. Summary A pulmonary embolism (PE) is a serious and potentially life-threatening condition. It happens when a blood clot from one part of the body travels to the arteries of the lung, causing a sudden blockage or decrease of blood flow to the lungs. This may result in shortness of breath, chest pain, dizziness, and fainting. Treatments for this condition usually include medicines to thin your blood (anticoagulants) or medicines to break apart blood clots. If you are given blood thinners, take your medicine exactly as told by your health care provider, at the same time every day. This is important. Understand what foods and drugs interact with any medicines that you are taking. If you have signs of PE or DVT, call your local emergency services (911 in the [place] This information is not intended to replace advice given to you by your health care provider. Make sure you discuss any questions you have with your health care provider. Document Revised: 05/26/2020 Document Reviewed: 05/26/2020 Elsevier  Elsevier Patient Education  2022 [employer]  

## 2021-03-08 NOTE — Progress Notes (Signed)
Virtual Visit via Telephone Note  I connected with Debra Bell on 03/08/21 at  2:10 PM EDT by telephone and verified that I am speaking with the correct person using two identifiers.  Location: Patient: home Provider: office   I discussed the limitations, risks, security and privacy concerns of performing an evaluation and management service by telephone and the availability of in person appointments. I also discussed with the patient that there may be a patient responsible charge related to this service. The patient expressed understanding and agreed to proceed.   History of Present Illness:  Patient presents today for a hospital follow-up/transition of care visit.  This is a televisit.  Patient was seen in the ED on 03/05/2021 and was diagnosed with pulmonary embolism.  Patient is 2 months postpartum.  Patient was started on Eliquis and prescribed prescription ibuprofen for pain.  Patient returned back to the ED on 03/06/2021 for continued pain and was treated with Toradol.  Patient does not currently have a PCP.  We discussed that we will get her set up with an appointment to establish care with a new PCP today.  Patient has picked up her prescription for Eliquis and is compliant with this medication.  She did not get her prescription for ibuprofen.  We will fill this today. Denies f/c/s, n/v/d, hemoptysis, PND, chest pain or edema.     Observations/Objective:  Vitals with BMI 03/06/2021 03/06/2021 03/06/2021  Height - - -  Weight - - -  BMI - - -  Systolic 129 133 409  Diastolic 84 89 97  Pulse 96 101 100      Assessment and Plan:  Pulmonary Embolism:  Continue Eliquis  Will resend prescription for Ibuprofen per ED order  Will schedule appointment to establish care with a new PCP.  Follow up:  Follow up as scheduled - please go to the ED if symptoms worsen  Patient Instructions  Pulmonary Embolism:  Continue Eliquis  Will resend prescription for Ibuprofen per  ED order  Will schedule appointment to establish care with a new PCP.  Follow up:  Follow up as scheduled - please go to the ED if symptoms worsen  Pulmonary Embolism A pulmonary embolism (PE) is a sudden blockage or decrease of blood flow in one or both lungs that happens when a clot travels into the arteries of the lung (pulmonary arteries). Most blockages come from a blood clot that forms in the vein of a leg or arm (deep vein thrombosis, DVT) and travels to the lungs. A clot is blood that has thickened into a gel or solid. PE is a dangerous and life-threatening condition that needs to be treated right away. What are the causes? This condition is usually caused by a blood clot that forms in a vein and moves to the lungs. In rare cases, it may be caused by air, fat, part of a tumor, or other tissue that moves through the veins and into the lungs. What increases the risk? The following factors may make you more likely to develop this condition: Experiencing a traumatic injury, such as breaking a hip or leg. Having: A spinal cord injury. Major surgery, especially hip or knee replacement, or surgery on parts of the nervous system or on the abdomen. A stroke. A blood-clotting disease. Long-term (chronic) lung or heart disease. Cancer, especially if you are being treated with chemotherapy. A central venous catheter. Taking medicines that contain estrogen. These include birth control pills and hormone replacement therapy. Being: Pregnant.  In the period of time after your baby is delivered (postpartum). Older than age 44. Overweight. A smoker, especially if you have other risks. Not very active (sedentary), not being able to move at all, or spending long periods sitting, such as travel over 6 hours. You are also at a greater risk if you have a leg in a cast or splint. What are the signs or symptoms? Symptoms of this condition usually start suddenly and include: Shortness of breath during  activity or at rest. Coughing, coughing up blood, or coughing up bloody mucus. Chest pain, back pain, or shoulder blade pain that gets worse with deep breaths. Rapid or irregular heartbeat. Feeling light-headed or dizzy, or fainting. Feeling anxious. Pain and swelling in a leg. This is a symptom of DVT, which can lead to PE. How is this diagnosed? This condition may be diagnosed based on your medical history, a physical exam, and tests. Tests may include: Blood tests. An ECG (electrocardiogram) of the heart. A CT pulmonary angiogram. This test checks blood flow in and around your lungs. A ventilation-perfusion scan, also called a lung VQ scan. This test measures air flow and blood flow to the lungs. An ultrasound to check for a DVT. How is this treated? Treatment for this condition depends on many factors, such as the cause of your PE, your risk for bleeding or developing more clots, and other medical conditions you may have. Treatment aims to stop blood clots from forming or growing larger. In some cases, treatment may be aimed at breaking apart or removing the blood clot. Treatment may include: Medicines, such as: Blood thinning medicines, also called anticoagulants, to stop clots from forming and growing. Medicines that break apart clots (fibrinolytics). Procedures, such as: Using a flexible tube to remove a blood clot (embolectomy) or to deliver medicine to destroy it (catheter-directed thrombolysis). Surgery to remove the clot (surgical embolectomy). This is rare. You may need a combination of immediate, long-term, and extended treatments. Your treatment may continue for several months (maintenance therapy) or longer depending on your medical conditions. You and your health care provider will work together to choose the treatment program that is best for you. Follow these instructions at home: Medicines Take over-the-counter and prescription medicines only as told by your health care  provider. If you are taking blood thinners: Talk with your health care provider before you take any medicines that contain aspirin or NSAIDs, such as ibuprofen. These medicines increase your risk for dangerous bleeding. Take your medicine exactly as told, at the same time every day. Avoid activities that could cause injury or bruising, and follow instructions about how to prevent falls. Wear a medical alert bracelet or carry a card that lists what medicines you take. Understand what foods and drugs interact with any medicines that you are taking. General instructions Ask your health care provider when you may return to your normal activities. Avoid sitting or lying for a long time without moving. Maintain a healthy weight. Ask your health care provider what weight is healthy for you. Do not use any products that contain nicotine or tobacco. These products include cigarettes, chewing tobacco, and vaping devices, such as e-cigarettes. If you need help quitting, ask your health care provider. Talk with your health care provider about any travel plans. It is important to make sure that you are still able to take your medicine while traveling. Keep all follow-up visits. This is important. Where to find more information American Lung Association: www.lung.org Centers for Disease  Control and Prevention: [employer]Ex[employer].com.br Contact a health care provider if: You missed a dose of your blood thinner medicine. You have a fever. Get help right away if: You have: New or increased pain, swelling, warmth, or redness in an arm or leg. Shortness of breath that gets worse during activity or at rest. Worsening chest pain. A rapid or irregular heartbeat. A severe headache. Vision changes. A serious fall or accident, or you hit your head. Blood in your vomit, stool, or urine. A cut that will not stop bleeding. You cough up blood. You feel light-headed or dizzy, and that feeling does not go away. You cannot move  your arms or legs. You are confused or have memory loss. These symptoms may represent a serious problem that is an emergency. Do not wait to see if the symptoms will go away. Get medical help right away. Call your local emergency services (911 in the [place] Do not drive yourself to the hospital. Summary A pulmonary embolism (PE) is a serious and potentially life-threatening condition. It happens when a blood clot from one part of the body travels to the arteries of the lung, causing a sudden blockage or decrease of blood flow to the lungs. This may result in shortness of breath, chest pain, dizziness, and fainting. Treatments for this condition usually include medicines to thin your blood (anticoagulants) or medicines to break apart blood clots. If you are given blood thinners, take your medicine exactly as told by your health care provider, at the same time every day. This is important. Understand what foods and drugs interact with any medicines that you are taking. If you have signs of PE or DVT, call your local emergency services (911 in the [place] This information is not intended to replace advice given to you by your health care provider. Make sure you discuss any questions you have with your health care provider. Document Revised: 05/26/2020 Document Reviewed: 05/26/2020 Elsevier Patient Education  2022 [employer].    I discussed the assessment and treatment plan with the patient. The patient was provided an opportunity to ask questions and all were answered. The patient agreed with the plan and demonstrated an understanding of the instructions.   The patient was advised to call back or seek an in-person evaluation if the symptoms worsen or if the condition fails to improve as anticipated.  I provided 23 minutes of non-face-to-face time during this encounter.   Ivonne Andrew, NP

## 2021-03-22 ENCOUNTER — Telehealth (HOSPITAL_COMMUNITY): Payer: Self-pay

## 2021-03-22 ENCOUNTER — Other Ambulatory Visit (HOSPITAL_COMMUNITY): Payer: Self-pay

## 2021-03-22 NOTE — Telephone Encounter (Signed)
Pharmacy Transitions of Care Follow-up Telephone Call  Date of discharge: 03/06/21  Discharge Diagnosis: Acute PE  How have you been since you were released from the hospital?  Patient doing well, no questions at this time.  Medication changes made at discharge:  - START: Eliquis Starter Pack, Nifedipine 60mg   Medication changes verified by the patient? Yes    Medication Accessibility:  Home Pharmacy: [facility]    Was the patient provided with refills on discharged medications? No refills on eliquis, 1 refill on nifedipine   Have all prescriptions been transferred from [facility] to home pharmacy? Yes  Is the patient able to afford medications? Has [employer] insurance    Medication Review:   APIXABAN (ELIQUIS)  Apixaban 10 mg BID initiated on 03/06/32. Will switch to apixaban 5 mg BID after 7 days (DATE 02/10/21).  - Discussed importance of taking medication around the same time everyday  - Advised patient of medications to avoid (NSAIDs, ASA)  - Educated that Tylenol (acetaminophen) will be the preferred analgesic to prevent risk of bleeding  - Emphasized importance of monitoring for signs and symptoms of bleeding (abnormal bruising, prolonged bleeding, nose bleeds, bleeding from gums, discolored urine, black tarry stools)  - Advised patient to alert all providers of anticoagulation therapy prior to starting a new medication or having a procedure    Follow-up Appointments:  PCP Hospital f/u appt confirmed?  Scheduled to see Dr. 04/12/21 on 04/03/21 @ [facility]   Specialist Hospital f/u appt confirmed? none  If their condition worsens, is the pt aware to call PCP or go to the Emergency Dept.? Yes  Final Patient Assessment: Patient has follow up scheduled and knows to get refills at that f/u

## 2021-04-03 ENCOUNTER — Ambulatory Visit: Payer: Medicaid Other | Admitting: Family Medicine

## 2021-04-07 DIAGNOSIS — Z419 Encounter for procedure for purposes other than remedying health state, unspecified: Secondary | ICD-10-CM | POA: Diagnosis not present

## 2021-04-13 ENCOUNTER — Encounter: Payer: Self-pay | Admitting: Radiology

## 2021-05-08 DIAGNOSIS — Z419 Encounter for procedure for purposes other than remedying health state, unspecified: Secondary | ICD-10-CM | POA: Diagnosis not present

## 2021-06-07 DIAGNOSIS — Z419 Encounter for procedure for purposes other than remedying health state, unspecified: Secondary | ICD-10-CM | POA: Diagnosis not present

## 2021-07-08 DIAGNOSIS — Z419 Encounter for procedure for purposes other than remedying health state, unspecified: Secondary | ICD-10-CM | POA: Diagnosis not present

## 2021-08-03 ENCOUNTER — Ambulatory Visit (INDEPENDENT_AMBULATORY_CARE_PROVIDER_SITE_OTHER): Payer: Medicaid Other | Admitting: Obstetrics & Gynecology

## 2021-08-03 ENCOUNTER — Encounter: Payer: Self-pay | Admitting: Obstetrics & Gynecology

## 2021-08-03 ENCOUNTER — Other Ambulatory Visit: Payer: Self-pay

## 2021-08-03 VITALS — BP 104/73 | HR 86 | Wt 296.0 lb

## 2021-08-03 DIAGNOSIS — Z86711 Personal history of pulmonary embolism: Secondary | ICD-10-CM

## 2021-08-03 DIAGNOSIS — Z975 Presence of (intrauterine) contraceptive device: Secondary | ICD-10-CM | POA: Diagnosis not present

## 2021-08-03 NOTE — Progress Notes (Signed)
Debra Bell IUD placed at time of c/s on 02/05/21. Pt has had irregular to no cycles since.

## 2021-08-03 NOTE — Progress Notes (Signed)
Patient ID: Debra Bell, female   DOB: [birth year], 31 y.o.   MRN: [id number]  Chief Complaint  Patient presents with   Amenorrhea    IUD in place    HPI Debra Bell is a 31 y.o. female.  [id number] No LMP recorded. She had menses in Nov and Dec that lasted 10 days while she was still taking Eliquis for PE treatment. She is now off Eliquis. She wonders if this is normal HPI  Past Medical History:  Diagnosis Date   Asthma    Bilateral ovarian cysts    History of ovarian cyst     Past Surgical History:  Procedure Laterality Date   CESAREAN SECTION     CESAREAN SECTION N/A 02/05/2021   Procedure: CESAREAN SECTION;  Surgeon: Tereso Newcomer, MD;  Location: [facility] LD ORS;  Service: Obstetrics;  Laterality: N/A;   OVARIAN CYST REMOVAL  2015   and 2013    No family history on file.  Social History Social History   Tobacco Use   Smoking status: Never   Smokeless tobacco: Never  Vaping Use   Vaping Use: Never used  Substance Use Topics   Alcohol use: Never   Drug use: Never    No Known Allergies  Current Outpatient Medications  Medication Sig Dispense Refill   APIXABAN (ELIQUIS) VTE STARTER PACK (10MG  AND 5MG ) Take as directed on package: start with two-5mg  tablets twice daily for 7 days. On day 8, switch to one-5mg  tablet twice daily. (Patient not taking: Reported on 08/03/2021) 74 each 0   ferrous sulfate 325 (65 FE) MG tablet Take 1 tablet (325 mg total) by mouth every other day. (Patient not taking: Reported on 08/03/2021) 30 tablet 3   ibuprofen (ADVIL) 800 MG tablet Take 1 tablet (800 mg total) by mouth 3 (three) times daily with meals as needed for headache or moderate pain. (Patient not taking: Reported on 08/03/2021) 30 tablet 3   NIFEdipine (ADALAT CC) 60 MG 24 hr tablet Take 1 tablet (60 mg total) by mouth daily. (Patient not taking: Reported on 08/03/2021) 30 tablet 1   Prenat-FeAsp-Meth-FA-DHA w/o A (PRENATE PIXIE) 10-0.6-0.4-200 MG CAPS Take 1 capsule by  mouth daily. (Patient not taking: Reported on 08/03/2021) 30 capsule 11   terbinafine (LAMISIL) 1 % cream Apply 1 application topically 2 (two) times daily. (Patient not taking: Reported on 08/03/2021)     No current facility-administered medications for this visit.    Review of Systems Review of Systems  Constitutional: Negative.   Respiratory: Negative.    Cardiovascular: Negative.   Genitourinary:  Positive for menstrual problem. Negative for pelvic pain and vaginal discharge.   Blood pressure 104/73, pulse 86, weight 296 lb (134.3 kg), not currently breastfeeding.  Physical Exam Physical Exam Vitals and nursing note reviewed.  Constitutional:      Appearance: Normal appearance.  Pulmonary:     Effort: Pulmonary effort is normal.  Skin:    General: Skin is warm and dry.  Neurological:     General: No focal deficit present.     Mental Status: She is alert.  Psychiatric:        Mood and Affect: Mood normal.    Data Reviewed Medications, ED visit notes  Assessment IUD (intrauterine device) in place  History of pulmonary embolism   Plan Now she is off her blood thinner her menses may be lighter and she is reassured and is happy to keep her IUD    08/05/2021 08/03/2021, 9:16  AM

## 2021-08-08 DIAGNOSIS — Z419 Encounter for procedure for purposes other than remedying health state, unspecified: Secondary | ICD-10-CM | POA: Diagnosis not present

## 2021-09-04 ENCOUNTER — Emergency Department (HOSPITAL_COMMUNITY)
Admission: EM | Admit: 2021-09-04 | Discharge: 2021-09-04 | Disposition: A | Discharge status: Home / Self Care | Payer: Medicaid Other | Attending: Emergency Medicine | Admitting: Emergency Medicine

## 2021-09-04 ENCOUNTER — Encounter (HOSPITAL_COMMUNITY): Payer: Self-pay

## 2021-09-04 ENCOUNTER — Other Ambulatory Visit: Payer: Self-pay

## 2021-09-04 DIAGNOSIS — M545 Low back pain, unspecified: Secondary | ICD-10-CM | POA: Diagnosis present

## 2021-09-04 DIAGNOSIS — Z7901 Long term (current) use of anticoagulants: Secondary | ICD-10-CM | POA: Insufficient documentation

## 2021-09-04 DIAGNOSIS — M5432 Sciatica, left side: Secondary | ICD-10-CM | POA: Diagnosis not present

## 2021-09-04 DIAGNOSIS — M5442 Lumbago with sciatica, left side: Secondary | ICD-10-CM | POA: Diagnosis not present

## 2021-09-04 MED ORDER — NAPROXEN 500 MG PO TABS: 500.0000 mg | ORAL_TABLET | Freq: Two times a day (BID) | ORAL | 0 refills | Status: DC

## 2021-09-04 MED ORDER — NAPROXEN 250 MG PO TABS
500.0000 mg | ORAL_TABLET | Freq: Once | ORAL | Status: AC
  Administered 2021-09-04: 500 mg via ORAL
  Filled 2021-09-04: qty 2

## 2021-09-04 MED ORDER — CYCLOBENZAPRINE HCL 10 MG PO TABS: 10.0000 mg | ORAL_TABLET | Freq: Two times a day (BID) | ORAL | 0 refills | Status: DC | PRN

## 2021-09-04 NOTE — ED Triage Notes (Addendum)
Pt arrived via POV for c/o 9/10 pressure pain in left lower back pain that radiates into left buttocksx5 days. Pt denies injury. Pt denies urinary issues. Pt walked well to triage room. Pt states took excedrin w/o relief. Pt states the first night she woke up her legs and feet were numb, but it hasn't happened since.

## 2021-09-04 NOTE — Discharge Instructions (Addendum)
Please take Flexeril and naproxen as needed for pain and spasm.  Do not mix Flexeril with alcohol or operate heavy machinery or take this medication as it can make you drowsy.  Return to the emergency department for worsening symptoms.

## 2021-09-04 NOTE — ED Provider Notes (Signed)
[facility] EMERGENCY DEPARTMENT Provider Note   CSN: [id number] Arrival date & time: 09/04/21  1710     History Chief Complaint  Patient presents with   Back Pain    Debra Bell is a 31 y.o. female who presents to the emergency department with left lower back pain that radiates down the back of the left leg for the last 5 days.  Patient states she woke up from bed and noticed that he had some pain in that area.  She describes this pain as a pressure and sharp sensation and she rates it moderate in severity.  Has not been taking anything for this pain.  She denies any bowel or bladder incontinence.  No weakness or numbness.  No injury or trauma.   Back Pain     Home Medications Prior to Admission medications   Medication Sig Start Date End Date Taking? Authorizing Provider  cyclobenzaprine (FLEXERIL) 10 MG tablet Take 1 tablet (10 mg total) by mouth 2 (two) times daily as needed for muscle spasms. 09/04/21  Yes Meredeth Ide, Makaela Cando M, PA-C  naproxen (NAPROSYN) 500 MG tablet Take 1 tablet (500 mg total) by mouth 2 (two) times daily. 09/04/21  Yes Meredeth Ide, Masiya Claassen M, PA-C  APIXABAN Everlene Balls) VTE STARTER PACK (10MG  AND 5MG ) Take as directed on package: start with two-5mg  tablets twice daily for 7 days. On day 8, switch to one-5mg  tablet twice daily. Patient not taking: Reported on 08/03/2021 03/06/21   08/05/2021, MD  ferrous sulfate 325 (65 FE) MG tablet Take 1 tablet (325 mg total) by mouth every other day. Patient not taking: Reported on 08/03/2021 02/10/21   08/05/2021, MD  ibuprofen (ADVIL) 800 MG tablet Take 1 tablet (800 mg total) by mouth 3 (three) times daily with meals as needed for headache or moderate pain. Patient not taking: Reported on 08/03/2021 03/08/21   08/05/2021, NP  NIFEdipine (ADALAT CC) 60 MG 24 hr tablet Take 1 tablet (60 mg total) by mouth daily. Patient not taking: Reported on 08/03/2021 02/22/21   08/05/2021, MD   Prenat-FeAsp-Meth-FA-DHA w/o A (PRENATE PIXIE) 10-0.6-0.4-200 MG CAPS Take 1 capsule by mouth daily. Patient not taking: Reported on 08/03/2021 09/08/20   08/05/2021, MD  terbinafine (LAMISIL) 1 % cream Apply 1 application topically 2 (two) times daily. Patient not taking: Reported on 08/03/2021    [provider]      Allergies    Patient has no known allergies.    Review of Systems   Review of Systems  Musculoskeletal:  Positive for back pain.   Physical Exam Updated Vital Signs BP 122/90 (BP Location: Left Arm)    Pulse 80    Temp 98.6 F (37 C) (Oral)    Resp 18    Ht 5\' 6"  (1.676 m)    Wt 134.3 kg    SpO2 100%    BMI 47.79 kg/m  Physical Exam Vitals and nursing note reviewed.  Constitutional:      Appearance: Normal appearance.  HENT:     Head: Normocephalic and atraumatic.  Eyes:     General:        Right eye: No discharge.        Left eye: No discharge.     Conjunctiva/sclera: Conjunctivae normal.  Pulmonary:     Effort: Pulmonary effort is normal.  Musculoskeletal:     Comments: Positive straight leg raise on the left.  There is point tenderness over the  left glute medius which causes shooting pain down the leg.  No midline tenderness over the thoracic or lumbar spine.  Skin:    General: Skin is warm and dry.     Findings: No rash.  Neurological:     General: No focal deficit present.     Mental Status: She is alert.  Psychiatric:        Mood and Affect: Mood normal.        Behavior: Behavior normal.    ED Results / Procedures / Treatments   Labs (all labs ordered are listed, but only abnormal results are displayed) Labs Reviewed - No data to display  EKG None  Radiology No results found.  Procedures Procedures    Medications Ordered in ED Medications  naproxen (NAPROSYN) tablet 500 mg (500 mg Oral Given 09/04/21 1809)    ED Course/ Medical Decision Making/ A&P                           Medical Decision Making Risk Prescription  drug management.   Meia Emley is a 31 y.o. female who presents the emergency department with 5-day history of left lower back pain.  This is likely sciatica.  I have a low suspicion for cauda equina at this time.  No midline tenderness over the back.  I will give her a dose of naproxen here.  I will send some Flexeril and naproxen to her pharmacy.  I instructed her this would not be a quick fix and will take time to get better.  I will give her discharge instructions regarding sciatica.  She was encouraged to return for any worsening symptoms.  She is safe for discharge.   Final Clinical Impression(s) / ED Diagnoses Final diagnoses:  Sciatica of left side    Rx / DC Orders ED Discharge Orders          Ordered    cyclobenzaprine (FLEXERIL) 10 MG tablet  2 times daily PRN        09/04/21 1810    naproxen (NAPROSYN) 500 MG tablet  2 times daily        09/04/21 1810              Honor Loh [place], [place] 09/04/21 1811    Charlynne Pander, MD 09/04/21 2055

## 2021-09-05 DIAGNOSIS — Z419 Encounter for procedure for purposes other than remedying health state, unspecified: Secondary | ICD-10-CM | POA: Diagnosis not present

## 2021-10-06 DIAGNOSIS — Z419 Encounter for procedure for purposes other than remedying health state, unspecified: Secondary | ICD-10-CM | POA: Diagnosis not present

## 2021-11-05 DIAGNOSIS — Z419 Encounter for procedure for purposes other than remedying health state, unspecified: Secondary | ICD-10-CM | POA: Diagnosis not present

## 2021-11-27 ENCOUNTER — Other Ambulatory Visit: Payer: Self-pay

## 2021-11-27 ENCOUNTER — Emergency Department (HOSPITAL_COMMUNITY)
Admission: EM | Admit: 2021-11-27 | Discharge: 2021-11-27 | Discharge status: Against Medical Advice | Payer: Medicaid Other | Attending: Physician Assistant | Admitting: Physician Assistant

## 2021-11-27 ENCOUNTER — Ambulatory Visit (INDEPENDENT_AMBULATORY_CARE_PROVIDER_SITE_OTHER): Payer: Medicaid Other | Admitting: Obstetrics

## 2021-11-27 ENCOUNTER — Encounter: Payer: Self-pay | Admitting: Obstetrics

## 2021-11-27 VITALS — BP 114/80 | HR 83 | Wt 293.0 lb

## 2021-11-27 DIAGNOSIS — N898 Other specified noninflammatory disorders of vagina: Secondary | ICD-10-CM | POA: Diagnosis not present

## 2021-11-27 DIAGNOSIS — Z3009 Encounter for other general counseling and advice on contraception: Secondary | ICD-10-CM

## 2021-11-27 DIAGNOSIS — R102 Pelvic and perineal pain: Secondary | ICD-10-CM | POA: Diagnosis not present

## 2021-11-27 DIAGNOSIS — Z5321 Procedure and treatment not carried out due to patient leaving prior to being seen by health care provider: Secondary | ICD-10-CM | POA: Diagnosis not present

## 2021-11-27 LAB — URINALYSIS, ROUTINE W REFLEX MICROSCOPIC
Bilirubin Urine: NEGATIVE
Glucose, UA: NEGATIVE mg/dL
Hgb urine dipstick: NEGATIVE
Ketones, ur: NEGATIVE mg/dL
Leukocytes,Ua: NEGATIVE
Nitrite: NEGATIVE
Protein, ur: NEGATIVE mg/dL
Specific Gravity, Urine: 1.03 (ref 1.005–1.030)
pH: 5 (ref 5.0–8.0)

## 2021-11-27 LAB — PREGNANCY, URINE: Preg Test, Ur: NEGATIVE

## 2021-11-27 NOTE — ED Triage Notes (Signed)
Pt c/o abd pain onset last Friday 5/12, IUD "came out," LMP last week. Went to [facility] & was advised to come to ED if continued pain. Pt denies urinary complaints

## 2021-11-27 NOTE — Progress Notes (Signed)
Pt states IUD fell out around 2 weeks ago. Pt states she is in pain, taking Naproxen.  Pt needs to discuss BC options.

## 2021-11-27 NOTE — Progress Notes (Signed)
Subjective:    Debra Bell is a 31 y.o. female who presents for contraception counseling. The patient has no complaints today. The patient is sexually active. Pertinent past medical history: thromboembolism.  The information documented in the HPI was reviewed and verified.  Menstrual History: OB History     Gravida  4   Para  3   Term  3   Preterm      AB  1   Living  3      SAB      IAB  1   Ectopic      Multiple  0   Live Births  3            No LMP recorded.   Patient Active Problem List   Diagnosis Date Noted   History of pulmonary embolism 08/03/2021   Past Medical History:  Diagnosis Date   Asthma    Bilateral ovarian cysts    History of ovarian cyst     Past Surgical History:  Procedure Laterality Date   CESAREAN SECTION     CESAREAN SECTION N/A 02/05/2021   Procedure: CESAREAN SECTION;  Surgeon: Tereso Newcomer, MD;  Location: [facility];  Service: Obstetrics;  Laterality: N/A;   OVARIAN CYST REMOVAL  2015   and 2013     Current Outpatient Medications:    naproxen (NAPROSYN) 500 MG tablet, Take 1 tablet (500 mg total) by mouth 2 (two) times daily., Disp: 30 tablet, Rfl: 0   cyclobenzaprine (FLEXERIL) 10 MG tablet, Take 1 tablet (10 mg total) by mouth 2 (two) times daily as needed for muscle spasms., Disp: 20 tablet, Rfl: 0   ferrous sulfate 325 (65 FE) MG tablet, Take 1 tablet (325 mg total) by mouth every other day. (Patient not taking: Reported on 08/03/2021), Disp: 30 tablet, Rfl: 3   ibuprofen (ADVIL) 800 MG tablet, Take 1 tablet (800 mg total) by mouth 3 (three) times daily with meals as needed for headache or moderate pain. (Patient not taking: Reported on 08/03/2021), Disp: 30 tablet, Rfl: 3   terbinafine (LAMISIL) 1 % cream, Apply 1 application topically 2 (two) times daily. (Patient not taking: Reported on 08/03/2021), Disp: , Rfl:  No Known Allergies  Social History   Tobacco Use   Smoking status: Never   Smokeless tobacco:  Never  Substance Use Topics   Alcohol use: Never    History reviewed. No pertinent family history.     Review of Systems Constitutional: negative for weight loss Genitourinary:negative for abnormal menstrual periods and vaginal discharge   Objective:   BP 114/80   Pulse 83   Wt 293 lb (132.9 kg)   BMI 47.29 kg/m    General:   Alert and no distress  Skin:   no rash or abnormalities  Lungs:   clear to auscultation bilaterally  Heart:   regular rate and rhythm, S1, S2 normal, no murmur, click, rub or gallop  Breasts:   normal without suspicious masses, skin or nipple changes or axillary nodes  Abdomen:  normal findings: no organomegaly, soft, non-tender and no hernia  Pelvis:  External genitalia: normal general appearance Urinary system: urethral meatus normal and bladder without fullness, nontender Vaginal: normal without tenderness, induration or masses Cervix: normal appearance Adnexa: normal bimanual exam Uterus: anteverted and non-tender, normal size   Lab Review Urine pregnancy test Labs reviewed yes Radiologic studies reviewed no  I have spent a total of 20 minutes of face-to-face time, excluding clinical staff  time, reviewing notes and preparing to see patient, ordering tests and/or medications, and counseling the patient.   Assessment:    31 y.o., starting tubal ligation, no contraindications. Patient has history of pulmonary embolism; therefore, hormonal contraception contraindicated Options discussed.  Tubal sterilization recommended and agreed to.  Tubal papers signed today.  Plan:   1. Encounter for other general counseling and advice on contraception - wants tubal sterilization    All questions answered. Agricultural engineer distributed. Follow up in 1 month.  Brock Bad, MD 11/27/2021 9:54 AM

## 2021-11-27 NOTE — ED Notes (Signed)
Patient called x3 for vitals recheck with no response and not visible in lobby 

## 2021-11-27 NOTE — ED Provider Triage Note (Signed)
Emergency Medicine Provider Triage Evaluation Note  Debra Bell , a 31 y.o. female  was evaluated in triage.  Pt complains of pelvic pain.  States that her IUD came out a few days ago on its own.  Since then she has had some spotting that improved but continues to have pelvic pain.  Has been treating this with naproxen but OB/GYN told her if it persisted to come to the ER.  Reports some vaginal discharge.  Denies any changes to bowel movements, urination or vomiting.  Review of Systems  Positive: Pelvic pain, vaginal discharge Negative: Vaginal bleeding, vomiting  Physical Exam  BP (!) 121/91 (BP Location: Right Arm)   Pulse 80   Temp 98.4 F (36.9 C) (Oral)   Resp 18   SpO2 100%  Gen:   Awake, no distress   Resp:  Normal effort  MSK:   Moves extremities without difficulty  Other:  Pelvic exam deferred  Medical Decision Making  Medically screening exam initiated at 3:16 PM.  Appropriate orders placed.  [name] was informed that the remainder of the evaluation will be completed by another provider, this initial triage assessment does not replace that evaluation, and the importance of remaining in the ED until their evaluation is complete.  Urine ordered   Dietrich Pates, PA-C 11/27/21 1517

## 2021-12-06 DIAGNOSIS — Z419 Encounter for procedure for purposes other than remedying health state, unspecified: Secondary | ICD-10-CM | POA: Diagnosis not present

## 2021-12-26 ENCOUNTER — Institutional Professional Consult (permissible substitution): Payer: Medicaid Other | Admitting: Obstetrics and Gynecology

## 2021-12-26 ENCOUNTER — Ambulatory Visit: Payer: Medicaid Other | Admitting: Obstetrics and Gynecology

## 2022-01-05 DIAGNOSIS — Z419 Encounter for procedure for purposes other than remedying health state, unspecified: Secondary | ICD-10-CM | POA: Diagnosis not present

## 2022-01-14 NOTE — Progress Notes (Signed)
Patient ID: Debra Bell, female    DOB: [birth year]  MRN: [id number]  CC: Follow-Up  Subjective: Debra Bell is a 31 y.o. female who presents for follow-up.   Her concerns today include:  01/16/2022 per triage RN note: Chief Complaint: headache Symptoms: headache pounding, throbbing, constant near temples and above eyes  Frequency: 2 days had gotten better but now worse than when first started  Pertinent Negatives: Patient denies blurred vision  Disposition: [x] ED /[] Urgent Care (no appt availability in office) / [] Appointment(In office/virtual)/ []  [place] [facility] [facility]/ [] Home Care/ [] Refused Recommended Disposition /[] [facility] [facility]/ []  Follow-up with PCP Additional Notes: pt state she woke up Monday morning by EMS and 911 and her family after having a seizure. EMS assessed her and VS were stable so pt didn't go to ED. She states that she has had constant headache and just unbearable at this point. Also has a deep cut on her tongue. Pt was wanting to come see PCP sooner than appt on 01/21/22. I advised pt no appts were available but also d/t symptoms and had first seizure going to ED would be best. Pt states she will go there.    Reason for Disposition  [1] SEVERE headache (e.g., excruciating) AND [2] "worst headache" of life  Answer Assessment - Initial Assessment Questions 1. LOCATION: "Where does it hurt?"      Temples above the eyes  2. ONSET: "When did the headache start?" (Minutes, hours or days)      Since 01/14/22 3. PATTERN: "Does the pain come and go, or has it been constant since it started?"     Constant had gotten better but now worse than when first started  4. SEVERITY: "How bad is the pain?" and "What does it keep you from doing?"  (e.g., Scale 1-10; mild, moderate, or severe)   - MILD (1-3): doesn't interfere with normal activities    - MODERATE (4-7): interferes with normal activities or awakens from sleep    - SEVERE (8-10): excruciating pain,  unable to do any normal activities        10 6. CAUSE: "What do you think is causing the headache?"     Post seizure on 01/14/22 9. OTHER SYMPTOMS: "Do you have any other symptoms?" (fever, stiff neck, eye pain, sore throat, cold symptoms)     Eye pain  Today's visit 01/21/2022: - Reports since EMS at her home on 01/14/2022 she has not been medically evaluated. Reports 01/15/2021 she went to Urgent Care. Was told there was a long wait  and was directed to the Emergency Department because of seizure the previous day. She went to the Emergency Department and was told it was a long wait. States she could not wait to be seen because she has a 32 y.o., 32 y.o., and 31 month old she has to provide care for. Reports she doesn't have childcare in [place]. Moved from her home state [place] several years ago. Since 01/14/2022 has noticed tingling bilateral hands, constant headache, and deceased appetite. Reports headache causes blurry vision and nausea. Had some Naproxen at home that she took for headache. Reports she consumes alcohol and occasional marijuana infrequently. - History of pulmonary embolism. Reports the last time she took Eliquis was around October 2022. Reports since then she is feeling back to normal hence no follow-up.  - Bilateral upper leg pain. Concerned about varicose veins. Skin nodule left upper leg considered if this is causing pain as well. Denies red flag  symptoms such as but not limited to bilateral lower leg extremity pain/tenderness/warmth/redness/swelling. - She is not breastfeeding.  Patient Active Problem List   Diagnosis Date Noted   History of pulmonary embolism 08/03/2021     No current outpatient medications on file prior to visit.   No current facility-administered medications on file prior to visit.    No Known Allergies  Social History   Socioeconomic History   Marital status: Single    Spouse name: Not on file   Number of children: Not on file   Years of education: Not on  file   Highest education level: Not on file  Occupational History   Not on file  Tobacco Use   Smoking status: Never    Passive exposure: Never   Smokeless tobacco: Never  Vaping Use   Vaping Use: Never used  Substance and Sexual Activity   Alcohol use: Never   Drug use: Never   Sexual activity: Not Currently    Birth control/protection: None  Other Topics Concern   Not on file  Social History Narrative   Not on file   Social Determinants of Health   Financial Resource Strain: Not on file  Food Insecurity: Not on file  Transportation Needs: Not on file  Physical Activity: Not on file  Stress: Not on file  Social Connections: Not on file  Intimate Partner Violence: Not on file    History reviewed. No pertinent family history.  Past Surgical History:  Procedure Laterality Date   CESAREAN SECTION     CESAREAN SECTION N/A 02/05/2021   Procedure: CESAREAN SECTION;  Surgeon: Osborne Oman, MD;  Location: [facility];  Service: Obstetrics;  Laterality: N/A;   OVARIAN CYST REMOVAL  2015   and 2013    ROS: Review of Systems Negative except as stated above  PHYSICAL EXAM: BP 106/74 (BP Location: Left Arm, Patient Position: Sitting, Cuff Size: Large)   Pulse 78   Temp 98.3 F (36.8 C)   Resp 16   Ht 5' 7.32" (1.71 m)   Wt 292 lb (132.5 kg)   SpO2 97%   Breastfeeding No   BMI 45.30 kg/m   Physical Exam HENT:     Head: Normocephalic and atraumatic.  Eyes:     Extraocular Movements: Extraocular movements intact.     Conjunctiva/sclera: Conjunctivae normal.     Pupils: Pupils are equal, round, and reactive to light.  Cardiovascular:     Rate and Rhythm: Normal rate and regular rhythm.     Pulses: Normal pulses.     Heart sounds: Normal heart sounds.  Pulmonary:     Effort: Pulmonary effort is normal.     Breath sounds: Normal breath sounds.  Musculoskeletal:     Cervical back: Normal range of motion and neck supple.     Right upper leg: Normal.     Left  upper leg: Normal.     Right knee: Normal.     Left knee: Normal.     Right lower leg: Normal.     Left lower leg: Normal.     Right ankle: Normal.     Left ankle: Normal.     Comments: Varicose veins bilateral lower extremities.   Skin:    General: Skin is warm and dry.     Comments: Hyperpigmented skin nodule left upper leg. No evidence of drainage.  Neurological:     General: No focal deficit present.     Mental Status: She is alert and oriented  to person, place, and time.  Psychiatric:        Mood and Affect: Mood normal.        Behavior: Behavior normal.     ASSESSMENT AND PLAN: 1. Single subsegmental pulmonary embolism without acute cor pulmonale [facility]) - Diagnosed August 2022. No follow-up since then. - Referral to Pulmonology for further evaluation and management. - Ambulatory referral to Pulmonology  2. Seizures ([place]) - CMP14+EGFR to check kidney function, liver function, and electrolyte balance.  - CBC to screen for anemia. - MR Brain for further evaluation.  - Referral to Neurology for further evaluation and management.  - Ambulatory referral to Neurology - CMP14+EGFR - CBC - MR Brain Wo Contrast; Future  3. Acute nonintractable headache, unspecified headache type - Ibuprofen as prescribed. Counseled on medication adherence and adverse effects.  - MR Brain for further evaluation.  - Referral to Neurology for further evaluation and management.  - ibuprofen (ADVIL) 600 MG tablet; Take 1 tablet (600 mg total) by mouth every 8 (eight) hours as needed for headache.  Dispense: 30 tablet; Refill: 2 - Ambulatory referral to Neurology - MR Brain Wo Contrast; Future  4. Varicose veins of both lower extremities with pain - Referral to Vascular Surgery for further evaluation and management.  - Ambulatory referral to Vascular Surgery  5. Nodule of skin of left lower extremity - Referral to Dermatology for further evaluation and management.  - Ambulatory referral to  Dermatology   Patient was given the opportunity to ask questions.  Patient verbalized understanding of the plan and was able to repeat key elements of the plan. Patient was given clear instructions to go to Emergency Department or return to medical center if symptoms don't improve, worsen, or new problems develop.The patient verbalized understanding.   Orders Placed This Encounter  Procedures   MR Brain Wo Contrast   CMP14+EGFR   CBC   Ambulatory referral to Pulmonology   Ambulatory referral to Neurology   Ambulatory referral to Vascular Surgery   Ambulatory referral to Dermatology     Requested Prescriptions   Signed Prescriptions Disp Refills   ibuprofen (ADVIL) 600 MG tablet 30 tablet 2    Sig: Take 1 tablet (600 mg total) by mouth every 8 (eight) hours as needed for headache.    Follow-up with primary provider as scheduled.  Camillia Herter, NP

## 2022-01-16 ENCOUNTER — Ambulatory Visit: Payer: Self-pay

## 2022-01-16 NOTE — Telephone Encounter (Signed)
  Chief Complaint: headache Symptoms: headache pounding, throbbing, constant near temples and above eyes  Frequency: 2 days had gotten better but now worse than when first started  Pertinent Negatives: Patient denies blurred vision  Disposition: [x] ED /[] Urgent Care (no appt availability in office) / [] Appointment(In office/virtual)/ []  [place] [facility]/ [] Home Care/ [] Refused Recommended Disposition /[] [place] [facility]/ []  Follow-up with PCP Additional Notes: pt state she woke up Monday morning by EMS and 911 and her family after having a seizure. EMS assessed her and VS were stable so pt didn't go to ED. She states that she has had constant headache and just unbearable at this point. Also has a deep cut on her tongue. Pt was wanting to come see PCP sooner than appt on 01/21/22. I advised pt no appts were available but also d/t symptoms and had first seizure going to ED would be best. Pt states she will go there.   Reason for Disposition  [1] SEVERE headache (e.g., excruciating) AND [2] "worst headache" of life  Answer Assessment - Initial Assessment Questions 1. LOCATION: "Where does it hurt?"      Temples above the eyes  2. ONSET: "When did the headache start?" (Minutes, hours or days)      Since 01/14/22 3. PATTERN: "Does the pain come and go, or has it been constant since it started?"     Constant had gotten better but now worse than when first started  4. SEVERITY: "How bad is the pain?" and "What does it keep you from doing?"  (e.g., Scale 1-10; mild, moderate, or severe)   - MILD (1-3): doesn't interfere with normal activities    - MODERATE (4-7): interferes with normal activities or awakens from sleep    - SEVERE (8-10): excruciating pain, unable to do any normal activities        10 6. CAUSE: "What do you think is causing the headache?"     Post seizure on 01/14/22 9. OTHER SYMPTOMS: "Do you have any other symptoms?" (fever, stiff neck, eye pain, sore throat, cold  symptoms)     Eye pain  Protocols used: Headache-A-AH

## 2022-01-21 ENCOUNTER — Ambulatory Visit (INDEPENDENT_AMBULATORY_CARE_PROVIDER_SITE_OTHER): Payer: Medicaid Other | Admitting: Family

## 2022-01-21 ENCOUNTER — Encounter: Payer: Self-pay | Admitting: Family

## 2022-01-21 VITALS — BP 106/74 | HR 78 | Temp 98.3°F | Resp 16 | Ht 67.32 in | Wt 292.0 lb

## 2022-01-21 DIAGNOSIS — I83813 Varicose veins of bilateral lower extremities with pain: Secondary | ICD-10-CM

## 2022-01-21 DIAGNOSIS — I2693 Single subsegmental pulmonary embolism without acute cor pulmonale: Secondary | ICD-10-CM | POA: Diagnosis not present

## 2022-01-21 DIAGNOSIS — R569 Unspecified convulsions: Secondary | ICD-10-CM

## 2022-01-21 DIAGNOSIS — R2242 Localized swelling, mass and lump, left lower limb: Secondary | ICD-10-CM | POA: Diagnosis not present

## 2022-01-21 DIAGNOSIS — R519 Headache, unspecified: Secondary | ICD-10-CM | POA: Diagnosis not present

## 2022-01-21 DIAGNOSIS — Z7689 Persons encountering health services in other specified circumstances: Secondary | ICD-10-CM

## 2022-01-21 MED ORDER — IBUPROFEN 600 MG PO TABS: 600.0000 mg | ORAL_TABLET | Freq: Three times a day (TID) | ORAL | 2 refills | Status: DC | PRN

## 2022-01-21 NOTE — Progress Notes (Signed)
Pt presents to establish care, has been having constant headache since Monday symptoms include blurred vision and nausea   On 01/14/22-Additional Notes: pt state she woke up Monday morning by EMS and 911 and her family after having a seizure. EMS assessed her and VS were stable so pt didn't go to ED. She states that she has had constant headache and just unbearable at this point. Also has a deep cut on her tongue. Pt was wanting to come see PCP sooner than appt on 01/21/22. I advised pt no appts were available but also d/t symptoms and had first seizure going to ED would be best. Pt states she will go there.   Needs referral to Neurology states that fingers are always tingling   Pt has history of pulmonary embolism states that her legs have began to give her pain again needs referral to Pulmonary

## 2022-01-22 LAB — CMP14+EGFR
ALT: 12 IU/L (ref 0–32)
AST: 12 IU/L (ref 0–40)
Albumin/Globulin Ratio: 1.6 (ref 1.2–2.2)
Albumin: 4.3 g/dL (ref 3.9–4.9)
Alkaline Phosphatase: 79 IU/L (ref 44–121)
BUN/Creatinine Ratio: 9 (ref 9–23)
BUN: 7 mg/dL (ref 6–20)
Bilirubin Total: <0.2 mg/dL (ref 0.0–1.2)
CO2: 22 mmol/L (ref 20–29)
Calcium: 9.3 mg/dL (ref 8.7–10.2)
Chloride: 104 mmol/L (ref 96–106)
Creatinine, Ser: 0.74 mg/dL (ref 0.57–1.00)
Globulin, Total: 2.7 g/dL (ref 1.5–4.5)
Glucose: 93 mg/dL (ref 70–99)
Potassium: 4.5 mmol/L (ref 3.5–5.2)
Sodium: 138 mmol/L (ref 134–144)
Total Protein: 7 g/dL (ref 6.0–8.5)
eGFR: 110 mL/min/{1.73_m2} (ref >59)

## 2022-01-22 LAB — CBC
Hematocrit: 42.3 % (ref 34.0–46.6)
Hemoglobin: 13.3 g/dL (ref 11.1–15.9)
MCH: 27.2 pg (ref 26.6–33.0)
MCHC: 31.4 g/dL — ABNORMAL LOW (ref 31.5–35.7)
MCV: 87 fL (ref 79–97)
Platelets: 261 10*3/uL (ref 150–450)
RBC: 4.89 x10E6/uL (ref 3.77–5.28)
RDW: 13 % (ref 11.7–15.4)
WBC: 4.9 10*3/uL (ref 3.4–10.8)

## 2022-01-29 ENCOUNTER — Other Ambulatory Visit: Payer: Self-pay | Admitting: *Deleted

## 2022-01-29 DIAGNOSIS — M79606 Pain in leg, unspecified: Secondary | ICD-10-CM

## 2022-02-05 DIAGNOSIS — Z419 Encounter for procedure for purposes other than remedying health state, unspecified: Secondary | ICD-10-CM | POA: Diagnosis not present

## 2022-02-07 ENCOUNTER — Encounter: Payer: Self-pay | Admitting: Pulmonary Disease

## 2022-02-07 ENCOUNTER — Ambulatory Visit (INDEPENDENT_AMBULATORY_CARE_PROVIDER_SITE_OTHER): Payer: Medicaid Other | Admitting: Pulmonary Disease

## 2022-02-07 VITALS — BP 118/76 | HR 69 | Ht 67.32 in | Wt 291.0 lb

## 2022-02-07 DIAGNOSIS — R0600 Dyspnea, unspecified: Secondary | ICD-10-CM

## 2022-02-07 DIAGNOSIS — Z6841 Body Mass Index (BMI) 40.0 and over, adult: Secondary | ICD-10-CM | POA: Diagnosis not present

## 2022-02-07 DIAGNOSIS — Z86711 Personal history of pulmonary embolism: Secondary | ICD-10-CM | POA: Diagnosis not present

## 2022-02-07 LAB — D-DIMER, QUANTITATIVE: D-Dimer, Quant: 1.05 mcg/mL FEU — ABNORMAL HIGH (ref <0.50)

## 2022-02-07 NOTE — Patient Instructions (Signed)
Chest tightness with shortness of breath in setting of prior pulmonary embolism: We will check a test called a D-dimer which looks to see if you have a blood clot.  If it is positive then we will need to do a CT angiogram of your chest Because of your history of pulmonary embolism we will check an echocardiogram to make sure there is no problems with your heart If these tests are both negative then it is important for you to exercise and keep trying to lose weight  Obesity and physical deconditioning: The following behaviors have been associated with weight loss: Weigh yourself daily Write down everything you eat Drink a glass of water prior to eating a meal Only eat when you are hungry Buy food from the periphery of the grocery store, not the middle  Exercise is an important way to stay healthy, live longer, and have a better quality of life. You should dedicate time 3-5 days a week to intentional exercise. Start by planning a workout you know you can complete.  Once you have completed it try repeating it 2 more times in a week before you make it harder. The best and most effective exercise routines gradually get more difficult (the walk is longer, the run is faster, the weight is higher, etc.). Your consistency will guarantee success more than any other factor: stick with it no matter what.  No excuses. It's OK to feel short of breath when you exercise, but lightheadedness or pain is not OK.   If you are not comfortable exercising on your own let me know so we can find someone to help you.  Follow up here in 4-6 weeks or sooner if needed

## 2022-02-07 NOTE — Progress Notes (Signed)
Synopsis: Referred in August 2023 for small subsegmental pulmonary embolism diagnosed in August 2022 when she was hospitalized for pre-eclampsia.   Subjective:   PATIENT ID: Debra Bell GENDER: female DOB: June 30, 1991, MRN: [id number]   HPI  Chief Complaint  Patient presents with   Consult    Referred by PCP for history of PE. Does have some SOB, especially when laying down.     Debra Bell had a blood clot in 02/2021 and was treated with Eliquis for 6 months.  She felt fine after she was diagnosed and taking Eliquis, however she started feeling some pains in her chest last month.  She says that she gets tightness in her chest when she is breathing.  If she takes a deep breath it feels like she pulled a muscle in her chest, particularly in her left chest.  She feels if she is laying flat.  She has a hard time keeping up with her boys when she is playing with them in the morning.  No leg pain or swelling.    She had asthma but it hasn't been flaring.    She had pre-eclampsia and was hospitalized for 2 days after delivery.  This was when she was diagnosed.  No family history of blood clots.  No smoking of any kind.   Records from her visit with her primary care provider Amy Gerilyn Nestle reviewed where she was referred to our clinic for evaluation of pulmonary embolism she was diagnosed with this in August 2022 and at that time did not have evidence of RV strain.  Past Medical History:  Diagnosis Date   Asthma    Bilateral ovarian cysts    History of ovarian cyst      History reviewed. No pertinent family history.   Social History   Socioeconomic History   Marital status: Single    Spouse name: Not on file   Number of children: Not on file   Years of education: Not on file   Highest education level: Not on file  Occupational History   Not on file  Tobacco Use   Smoking status: Never    Passive exposure: Never   Smokeless tobacco: Never  Vaping Use   Vaping Use: Never used   Substance and Sexual Activity   Alcohol use: Never   Drug use: Never   Sexual activity: Not Currently    Birth control/protection: None  Other Topics Concern   Not on file  Social History Narrative   Not on file   Social Determinants of Health   Financial Resource Strain: Not on file  Food Insecurity: Not on file  Transportation Needs: Not on file  Physical Activity: Not on file  Stress: Not on file  Social Connections: Not on file  Intimate Partner Violence: Not on file     No Known Allergies   Outpatient Medications Prior to Visit  Medication Sig Dispense Refill   ibuprofen (ADVIL) 600 MG tablet Take 1 tablet (600 mg total) by mouth every 8 (eight) hours as needed for headache. 30 tablet 2   No facility-administered medications prior to visit.    Review of Systems  Constitutional:  Negative for chills, fever, malaise/fatigue and weight loss.  HENT:  Negative for congestion, nosebleeds, sinus pain and sore throat.   Eyes:  Negative for photophobia, pain and discharge.  Respiratory:  Positive for shortness of breath. Negative for cough, hemoptysis, sputum production and wheezing.   Cardiovascular:  Positive for chest pain. Negative for palpitations, orthopnea  and leg swelling.  Gastrointestinal:  Negative for abdominal pain, constipation, diarrhea, nausea and vomiting.  Genitourinary:  Negative for dysuria, frequency, hematuria and urgency.  Musculoskeletal:  Negative for back pain, joint pain, myalgias and neck pain.  Skin:  Negative for itching and rash.  Neurological:  Negative for tingling, tremors, sensory change, speech change, focal weakness, seizures, weakness and headaches.  Psychiatric/Behavioral:  Negative for memory loss, substance abuse and suicidal ideas (.dbmex). The patient is not nervous/anxious.       Objective:  Physical Exam   Vitals:   02/07/22 1447  BP: 118/76  Pulse: 69  SpO2: 99%  Weight: 291 lb (132 kg)  Height: 5' 7.32" (1.71 m)     Gen: obese but well appearing, no acute distress HENT: NCAT, OP clear, neck supple without masses Eyes: PERRL, EOMi Lymph: no cervical lymphadenopathy PULM: CTA B CV: RRR, no mgr, no JVD GI: BS+, soft, nontender, no hsm Derm: no rash or skin breakdown MSK: normal bulk and tone Neuro: A&Ox4, CN II-XII intact, strength 5/5 in all 4 extremities Psyche: normal mood and affect   CBC    Component Value Date/Time   WBC 4.9 01/21/2022 1042   WBC 9.8 03/06/2021 0558   RBC 4.89 01/21/2022 1042   RBC 4.02 03/06/2021 0558   HGB 13.3 01/21/2022 1042   HCT 42.3 01/21/2022 1042   PLT 261 01/21/2022 1042   MCV 87 01/21/2022 1042   MCH 27.2 01/21/2022 1042   MCH 26.6 03/06/2021 0558   MCHC 31.4 (L) 01/21/2022 1042   MCHC 30.1 03/06/2021 0558   RDW 13.0 01/21/2022 1042   LYMPHSABS 2.1 03/05/2021 1823   LYMPHSABS 2.3 09/12/2020 1450   MONOABS 0.8 03/05/2021 1823   EOSABS 0.0 03/05/2021 1823   EOSABS 0.0 09/12/2020 1450   BASOSABS 0.0 03/05/2021 1823   BASOSABS 0.0 09/12/2020 1450     Chest imaging: August 2022 CT angiogram chest images independently reviewed showing segmental filling defect in the right lower lobe pulmonary artery but the remainder of the pulmonary vasculature is open, nonspecific groundglass opacification in the bases bilaterally in a dependent distribution  PFT:  Labs:  Path:  Echo:  Heart Catheterization:       Assessment & Plan:   History of pulmonary embolism - Plan: ECHOCARDIOGRAM COMPLETE, D-Dimer, Quantitative  Dyspnea, unspecified type - Plan: ECHOCARDIOGRAM COMPLETE, D-Dimer, Quantitative  Morbid obesity with BMI of 45.0-49.9, adult [facility])  Discussion: Pleasant 31 year old female with a history of provoked pulmonary embolism in August 2022 presents to my clinic today for evaluation of mild shortness of breath and chest tightness.  The symptoms developed approximately 2 months after she stopped taking Eliquis.  She has no leg pain or  swelling.  I think the most likely explanation for her symptoms are deconditioning, the likelihood of underlying cardiac disease is very low.  However, because of her history of pulmonary embolism we do need to make sure there is no evidence of recurrence.  Plan: Chest tightness with shortness of breath in setting of prior pulmonary embolism: We will check a test called a D-dimer which looks to see if you have a blood clot.  If it is positive then we will need to do a CT angiogram of your chest Because of your history of pulmonary embolism we will check an echocardiogram to make sure there is no problems with your heart If these tests are both negative then it is important for you to exercise and keep trying to lose weight  Obesity  and physical deconditioning: The following behaviors have been associated with weight loss: Weigh yourself daily Write down everything you eat Drink a glass of water prior to eating a meal Only eat when you are hungry Buy food from the periphery of the grocery store, not the middle  Exercise is an important way to stay healthy, live longer, and have a better quality of life. You should dedicate time 3-5 days a week to intentional exercise. Start by planning a workout you know you can complete.  Once you have completed it try repeating it 2 more times in a week before you make it harder. The best and most effective exercise routines gradually get more difficult (the walk is longer, the run is faster, the weight is higher, etc.). Your consistency will guarantee success more than any other factor: stick with it no matter what.  No excuses. It's OK to feel short of breath when you exercise, but lightheadedness or pain is not OK.   If you are not comfortable exercising on your own let me know so we can find someone to help you.  Follow up here in 4-6 weeks with a nurse practitioner or sooner if needed   Current Outpatient Medications:    ibuprofen (ADVIL) 600 MG  tablet, Take 1 tablet (600 mg total) by mouth every 8 (eight) hours as needed for headache., Disp: 30 tablet, Rfl: 2

## 2022-02-08 ENCOUNTER — Ambulatory Visit (HOSPITAL_BASED_OUTPATIENT_CLINIC_OR_DEPARTMENT_OTHER)
Admission: RE | Admit: 2022-02-08 | Discharge: 2022-02-08 | Disposition: A | Discharge status: Home / Self Care | Payer: Medicaid Other | Source: Ambulatory Visit | Attending: Adult Health | Admitting: Adult Health

## 2022-02-08 ENCOUNTER — Telehealth: Payer: Self-pay | Admitting: Adult Health

## 2022-02-08 DIAGNOSIS — Z86711 Personal history of pulmonary embolism: Secondary | ICD-10-CM | POA: Insufficient documentation

## 2022-02-08 DIAGNOSIS — R569 Unspecified convulsions: Secondary | ICD-10-CM | POA: Diagnosis not present

## 2022-02-08 DIAGNOSIS — R7989 Other specified abnormal findings of blood chemistry: Secondary | ICD-10-CM | POA: Diagnosis not present

## 2022-02-08 DIAGNOSIS — R918 Other nonspecific abnormal finding of lung field: Secondary | ICD-10-CM | POA: Diagnosis not present

## 2022-02-08 MED ORDER — IOHEXOL 350 MG/ML SOLN
80.0000 mL | Freq: Once | INTRAVENOUS | Status: AC | PRN
  Administered 2022-02-08: 80 mL via INTRAVENOUS

## 2022-02-08 NOTE — Telephone Encounter (Signed)
Also please confirm pregnancy status. - can check pregnancy test

## 2022-02-08 NOTE — Telephone Encounter (Signed)
Pt has been scheduled for this evening at 6:00 at [facility].  Spoke to pt & gave her appt info.  Nothing further needed.

## 2022-02-08 NOTE — Telephone Encounter (Signed)
ATC LVMTCB x 1  

## 2022-02-08 NOTE — Telephone Encounter (Signed)
Patient with history of provoked PE.  Was seen in the office February 07, 2022 with Dr. Kendrick Fries.  Was having some intermittent chest tightness and shortness of breath.  Patient was set up for D-dimer.  Dr. Kendrick Fries indicated if test was positive would need a CT angio to rule out PE.  D-dimer came back elevated today at 1.05.   Patient will need to be set up for a stat CT chest PE protocol  Called patient to discuss results.  No answer left on voicemail.  Triage please call patient to set up stat CT chest with PE protocol.  Please let me know if you are unable to contact patient

## 2022-02-08 NOTE — Telephone Encounter (Signed)
Spoke with pt and notified her of D-Dimer result and need for CT. Pt stated understanding. CTA ordered STAT.   Routing to [facility] pool for follow up

## 2022-02-08 NOTE — Telephone Encounter (Signed)
Called emergancy contact at [facility] request. LVMTCB x 1

## 2022-02-11 NOTE — Progress Notes (Signed)
Schedule appointment to discuss more on gallstones.

## 2022-02-12 ENCOUNTER — Telehealth: Payer: Self-pay | Admitting: *Deleted

## 2022-02-12 NOTE — Progress Notes (Unsigned)
Patient ID: Debra Bell, female    DOB: March 22, 1991  MRN: [id number]  CC: No chief complaint on file.   Subjective: Debra Bell is a 31 y.o. female who presents for  Her concerns today include:   She is established with Pulmonology. Recent positive D dimer and stat CTA ordered 02/08/2022  Last appt at 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  - Liver function normal.  - No anemia.  - All other values are normal, stable or within acceptable limits.  I do not see where MR Brain has been scheduled as of yet    Patient Active Problem List   Diagnosis Date Noted   History of pulmonary embolism 08/03/2021     Current Outpatient Medications on File Prior to Visit  Medication Sig Dispense Refill   ibuprofen (ADVIL) 600 MG tablet Take 1 tablet (600 mg total) by mouth every 8 (eight) hours as needed for headache. 30 tablet 2   No current facility-administered medications on file prior to visit.    No Known Allergies  Social History   Socioeconomic History   Marital status: Single    Spouse name: Not on file   Number of children: Not on file   Years of education: Not on file   Highest education level: Not on file  Occupational History   Not on file  Tobacco Use   Smoking status: Never    Passive exposure: Never   Smokeless tobacco: Never  Vaping Use   Vaping Use: Never used  Substance and Sexual Activity   Alcohol use: Never   Drug use: Never   Sexual activity: Not Currently    Birth control/protection: None  Other Topics Concern   Not on file  Social History Narrative   Not on file   Social Determinants of Health   Financial Resource Strain: Not on file  Food Insecurity: Not on file  Transportation  Needs: Not on file  Physical Activity: Not on file  Stress: Not on file  Social Connections: Not on file  Intimate Partner Violence: Not on file    No family history on file.  Past Surgical History:  Procedure Laterality Date   CESAREAN SECTION     CESAREAN SECTION N/A 02/05/2021   Procedure: CESAREAN SECTION;  Surgeon: Tereso Newcomer, MD;  Location: [facility] LD ORS;  Service: Obstetrics;  Laterality: N/A;   OVARIAN CYST REMOVAL  2015   and 2013    ROS: Review of Systems Negative except as stated above  PHYSICAL EXAM: There were no vitals taken for this visit.  Physical Exam  {female adult master:310786} {female adult master:310785}     Latest Ref Rng & Units 01/21/2022   10:42 AM 03/06/2021    5:58 AM 03/05/2021    6:23 PM  CMP  Glucose 70 - 99 mg/dL 93  409  811   BUN 6 - 20 mg/dL 7  5  <5   Creatinine 9.14 - 1.00 mg/dL 7.82  9.56  2.13   Sodium 134 - 144 mmol/L 138  136  136   Potassium 3.5 - 5.2 mmol/L 4.5  4.0  3.9   Chloride 96 - 106 mmol/L 104  102  105   CO2 20 - 29  mmol/L 22  25  22    Calcium 8.7 - 10.2 mg/dL 9.3  9.3  9.6   Total Protein 6.0 - 8.5 g/dL 7.0   7.8   Total Bilirubin 0.0 - 1.2 mg/dL <1.6   0.5   Alkaline Phos 44 - 121 IU/L 79   99   AST 0 - 40 IU/L 12   14   ALT 0 - 32 IU/L 12   10    Lipid Panel  No results found for: "CHOL", "TRIG", "HDL", "CHOLHDL", "VLDL", "LDLCALC", "LDLDIRECT"  CBC    Component Value Date/Time   WBC 4.9 01/21/2022 1042   WBC 9.8 03/06/2021 0558   RBC 4.89 01/21/2022 1042   RBC 4.02 03/06/2021 0558   HGB 13.3 01/21/2022 1042   HCT 42.3 01/21/2022 1042   PLT 261 01/21/2022 1042   MCV 87 01/21/2022 1042   MCH 27.2 01/21/2022 1042   MCH 26.6 03/06/2021 0558   MCHC 31.4 (L) 01/21/2022 1042   MCHC 30.1 03/06/2021 0558   RDW 13.0 01/21/2022 1042   LYMPHSABS 2.1 03/05/2021 1823   LYMPHSABS 2.3 09/12/2020 1450   MONOABS 0.8 03/05/2021 1823   EOSABS 0.0 03/05/2021 1823   EOSABS 0.0 09/12/2020 1450   BASOSABS 0.0  03/05/2021 1823   BASOSABS 0.0 09/12/2020 1450    ASSESSMENT AND PLAN:  There are no diagnoses linked to this encounter.   Patient was given the opportunity to ask questions.  Patient verbalized understanding of the plan and was able to repeat key elements of the plan. Patient was given clear instructions to go to Emergency Department or return to medical center if symptoms don't improve, worsen, or new problems develop.The patient verbalized understanding.   No orders of the defined types were placed in this encounter.    Requested Prescriptions    No prescriptions requested or ordered in this encounter    No follow-ups on file.  Rema Fendt, NP

## 2022-02-12 NOTE — Telephone Encounter (Signed)
-----   Message from Lupita Leash, MD sent at 02/11/2022  1:12 PM EDT ----- Hi,  Please call her and let her know that her CT chest was normal and she does not need to take blood thinners at this point.  Thanks, Kipp Brood

## 2022-02-12 NOTE — Telephone Encounter (Signed)
Called the pt and there was no answer- LMTCB    

## 2022-02-13 ENCOUNTER — Encounter: Payer: Medicaid Other | Admitting: Family

## 2022-02-13 ENCOUNTER — Other Ambulatory Visit: Payer: Self-pay | Admitting: Family

## 2022-02-13 ENCOUNTER — Encounter: Payer: Self-pay | Admitting: Family

## 2022-02-13 DIAGNOSIS — K8020 Calculus of gallbladder without cholecystitis without obstruction: Secondary | ICD-10-CM

## 2022-02-13 DIAGNOSIS — Z712 Person consulting for explanation of examination or test findings: Secondary | ICD-10-CM

## 2022-02-13 NOTE — Telephone Encounter (Signed)
Called patient and let her know the results of CT from Dr Kendrick Fries. And that she does not need to be on blood thinners. Nothing further needed

## 2022-02-14 ENCOUNTER — Telehealth: Payer: Self-pay

## 2022-02-14 ENCOUNTER — Encounter: Payer: Self-pay | Admitting: Neurology

## 2022-02-14 ENCOUNTER — Ambulatory Visit (INDEPENDENT_AMBULATORY_CARE_PROVIDER_SITE_OTHER): Payer: Medicaid Other | Admitting: Neurology

## 2022-02-14 VITALS — BP 124/85 | HR 83 | Ht 67.3 in | Wt 292.5 lb

## 2022-02-14 DIAGNOSIS — G40909 Epilepsy, unspecified, not intractable, without status epilepticus: Secondary | ICD-10-CM

## 2022-02-14 DIAGNOSIS — R519 Headache, unspecified: Secondary | ICD-10-CM

## 2022-02-14 NOTE — Progress Notes (Unsigned)
Office Note     CC: Bilateral lower extremity varicosities Requesting Provider:  Rema Fendt, NP  HPI: Debra Bell is a 31 y.o. (Sep 13, 1990) female who presents at the request of Rema Fendt, NP for evaluation of bilateral lower extremity varicose..  On exam today, Debra Bell was doing well.  She was accompanied by her 39-year-old son.  Over the last few years she has appreciated significant weight gain, which was further exacerbated by the birth of her son.  Has been accompanied by pain in bilateral lower extremities as well as varicosities appreciated in the thighs.   Venous symptoms include: positive if (X) [  ] aching [  ] heavy [  ] tired  [  ] throbbing [  ] burning  [  ] itching [  ]swelling [  ] bleeding [  ] ulcer  Onset/duration:  ***  Occupation:  *** Aggravating factors: (sitting, standing) Alleviating factors: (elevation) Compression:  *** Helps:  *** Pain medications:  *** Previous vein procedures:  *** History of DVT:  ***   The pt *** on a statin for cholesterol management.  The pt *** on a daily aspirin.   Other AC:  *** The pt *** on *** for hypertension.   The pt *** diabetic.  *** Tobacco hx:  ***  Past Medical History:  Diagnosis Date   Asthma    Bilateral ovarian cysts    History of ovarian cyst     Past Surgical History:  Procedure Laterality Date   CESAREAN SECTION     CESAREAN SECTION N/A 02/05/2021   Procedure: CESAREAN SECTION;  Surgeon: Tereso Newcomer, MD;  Location: [facility] LD ORS;  Service: Obstetrics;  Laterality: N/A;   OVARIAN CYST REMOVAL  2015   and 2013    Social History   Socioeconomic History   Marital status: Single    Spouse name: Not on file   Number of children: Not on file   Years of education: Not on file   Highest education level: Not on file  Occupational History   Not on file  Tobacco Use   Smoking status: Never    Passive exposure: Never   Smokeless tobacco: Never  Vaping Use   Vaping Use:  Never used  Substance and Sexual Activity   Alcohol use: Never   Drug use: Never   Sexual activity: Not Currently    Birth control/protection: None  Other Topics Concern   Not on file  Social History Narrative   Not on file   Social Determinants of Health   Financial Resource Strain: Not on file  Food Insecurity: Not on file  Transportation Needs: Not on file  Physical Activity: Not on file  Stress: Not on file  Social Connections: Not on file  Intimate Partner Violence: Not on file   ***No family history on file.  Current Outpatient Medications  Medication Sig Dispense Refill   ibuprofen (ADVIL) 600 MG tablet Take 1 tablet (600 mg total) by mouth every 8 (eight) hours as needed for headache. 30 tablet 2   No current facility-administered medications for this visit.    No Known Allergies   REVIEW OF SYSTEMS:  *** [X]  denotes positive finding, [ ]  denotes negative finding Cardiac  Comments:  Chest pain or chest pressure:    Shortness of breath upon exertion:    Short of breath when lying flat:    Irregular heart rhythm:        Vascular    Pain  in calf, thigh, or hip brought on by ambulation:    Pain in feet at night that wakes you up from your sleep:     Blood clot in your veins:    Leg swelling:         Pulmonary    Oxygen at home:    Productive cough:     Wheezing:         Neurologic    Sudden weakness in arms or legs:     Sudden numbness in arms or legs:     Sudden onset of difficulty speaking or slurred speech:    Temporary loss of vision in one eye:     Problems with dizziness:         Gastrointestinal    Blood in stool:     Vomited blood:         Genitourinary    Burning when urinating:     Blood in urine:        Psychiatric    Major depression:         Hematologic    Bleeding problems:    Problems with blood clotting too easily:        Skin    Rashes or ulcers:        Constitutional    Fever or chills:      PHYSICAL  EXAMINATION:  There were no vitals filed for this visit.  General:  WDWN in NAD; vital signs documented above Gait: Not observed HENT: WNL, normocephalic Pulmonary: normal non-labored breathing , without Rales, rhonchi,  wheezing Cardiac: {Desc; regular/irreg:14544} HR, without  Murmurs {With/Without:20273} carotid bruit*** Abdomen: soft, NT, no masses Skin: {With/Without:20273} rashes Vascular Exam/Pulses:  Right Left  Radial {Exam; arterial pulse strength 0-4:30167} {Exam; arterial pulse strength 0-4:30167}  Ulnar {Exam; arterial pulse strength 0-4:30167} {Exam; arterial pulse strength 0-4:30167}  Femoral {Exam; arterial pulse strength 0-4:30167} {Exam; arterial pulse strength 0-4:30167}  Popliteal {Exam; arterial pulse strength 0-4:30167} {Exam; arterial pulse strength 0-4:30167}  DP {Exam; arterial pulse strength 0-4:30167} {Exam; arterial pulse strength 0-4:30167}  PT {Exam; arterial pulse strength 0-4:30167} {Exam; arterial pulse strength 0-4:30167}   Extremities: {With/Without:20273} ischemic changes, {With/Without:20273} Gangrene , {With/Without:20273} cellulitis; {With/Without:20273} open wounds;  Musculoskeletal: no muscle wasting or atrophy  Neurologic: A&O X 3;  No focal weakness or paresthesias are detected Psychiatric:  The pt has {Desc; normal/abnormal:11317::"Normal"} affect.   Non-Invasive Vascular Imaging:   ***    ASSESSMENT/PLAN:: 31 y.o. female presenting with ***   Therapy, referred to general surgery for bariatric surgery.   Victorino Sparrow, MD [facility] [phone]

## 2022-02-14 NOTE — Patient Instructions (Signed)
MRI Brain with and without contrast  MRV Head  Routine EEG  Driving restriction for the next 6 months  Follow up in 6 months or sooner if worse

## 2022-02-14 NOTE — Telephone Encounter (Signed)
Spoke with pt she confirmed her appt for Tuesday

## 2022-02-14 NOTE — Progress Notes (Signed)
[facility]  PATIENT: Debra Bell DOB: [birth year]  REQUESTING CLINICIAN: Rema Fendt, NP HISTORY FROM: Patient  REASON FOR VISIT: New onset seizure   HISTORICAL  CHIEF COMPLAINT:  Chief Complaint  Patient presents with   New Patient (Initial Visit)    Rm 13. Alone. NP/internal referral for seizures, headaches, tingling of bilateral hands. States headaches are dull- have began since seizures. Headaches are mostly during the evening and at night.    HISTORY OF PRESENT ILLNESS:  This is a 31 year old woman with past medical history of asthma, history of  PE, preeclampsia who is presenting after first lifetime seizure.  Patient reports the day of July 9 she had a headache the whole day, she went to bed around 1130 PM and woken up by EMS and family member around her.  She was told that she had a seizure, generalized convulsion with tongue biting.  She reports being back to her baseline and therefore did not go to the ED.  Since then, she has been having constant headache.  She patient reports she had a headache every single day.  Described headache as diffuse aching and sometimes it will fluctuate in intensity.  No nausea no vomiting associated with the headache.  She does report having a toddler and has been sleep deprived.  She denies any family history of seizures, denies any seizure risk factors, denies using illicit drugs and no alcohol.  On top of that she does report tingling in both hands that is constant, she states now she is used to it.   Handedness: Right handed   Onset: July 10   Seizure Type: Convulsion   Current frequency: Only once   Any injuries from seizures: Tongue biting   Seizure risk factors: None reported   Previous ASMs: None   Currenty ASMs: None   ASMs side effects: N/A   Brain Images: not done   Previous EEGs: not done    OTHER MEDICAL CONDITIONS: Asthma, History of PE  REVIEW OF SYSTEMS: Full 14 system  review of systems performed and negative with exception of: As noted in the HPI   ALLERGIES: No Known Allergies  HOME MEDICATIONS: Outpatient Medications Prior to Visit  Medication Sig Dispense Refill   ibuprofen (ADVIL) 600 MG tablet Take 1 tablet (600 mg total) by mouth every 8 (eight) hours as needed for headache. 30 tablet 2   No facility-administered medications prior to visit.    PAST MEDICAL HISTORY: Past Medical History:  Diagnosis Date   Asthma    Bilateral ovarian cysts    History of ovarian cyst     PAST SURGICAL HISTORY: Past Surgical History:  Procedure Laterality Date   CESAREAN SECTION     CESAREAN SECTION N/A 02/05/2021   Procedure: CESAREAN SECTION;  Surgeon: Tereso Newcomer, MD;  Location: [facility];  Service: Obstetrics;  Laterality: N/A;   OVARIAN CYST REMOVAL  2015   and 2013    FAMILY HISTORY: History reviewed. No pertinent family history.  SOCIAL HISTORY: Social History   Socioeconomic History   Marital status: Single    Spouse name: Not on file   Number of children: Not on file   Years of education: Not on file   Highest education level: Not on file  Occupational History   Not on file  Tobacco Use   Smoking status: Never    Passive exposure: Never   Smokeless tobacco: Never  Vaping Use   Vaping Use: Never used  Substance  and Sexual Activity   Alcohol use: Never   Drug use: Never   Sexual activity: Not Currently    Birth control/protection: None  Other Topics Concern   Not on file  Social History Narrative   Not on file   Social Determinants of Health   Financial Resource Strain: Not on file  Food Insecurity: Not on file  Transportation Needs: Not on file  Physical Activity: Not on file  Stress: Not on file  Social Connections: Not on file  Intimate Partner Violence: Not on file    PHYSICAL EXAM  GENERAL EXAM/CONSTITUTIONAL: Vitals:  Vitals:   02/14/22 0748  BP: 124/85  Pulse: 83  Weight: 292 lb 8 oz (132.7 kg)   Height: 5' 7.3" (1.709 m)   Body mass index is 45.4 kg/m. Wt Readings from Last 3 Encounters:  02/14/22 292 lb 8 oz (132.7 kg)  02/07/22 291 lb (132 kg)  01/21/22 292 lb (132.5 kg)   Patient is in no distress; well developed, nourished and groomed; neck is supple  EYES: Pupils round and reactive to light, Visual fields full to confrontation, Extraocular movements intacts,  No results found.  MUSCULOSKELETAL: Gait, strength, tone, movements noted in Neurologic exam below  NEUROLOGIC: MENTAL STATUS:      No data to display         awake, alert, oriented to person, place and time recent and remote memory intact normal attention and concentration language fluent, comprehension intact, naming intact fund of knowledge appropriate  CRANIAL NERVE:  2nd, 3rd, 4th, 6th - pupils equal and reactive to light, visual fields full to confrontation, extraocular muscles intact, no nystagmus 5th - facial sensation symmetric 7th - facial strength symmetric 8th - hearing intact 9th - palate elevates symmetrically, uvula midline 11th - shoulder shrug symmetric 12th - tongue protrusion midline  MOTOR:  normal bulk and tone, full strength in the BUE, BLE  SENSORY:  normal and symmetric to light touch  COORDINATION:  finger-nose-finger, fine finger movements normal  GAIT/STATION:  normal   DIAGNOSTIC DATA (LABS, IMAGING, TESTING) - I reviewed patient records, labs, notes, testing and imaging myself where available.  Lab Results  Component Value Date   WBC 4.9 01/21/2022   HGB 13.3 01/21/2022   HCT 42.3 01/21/2022   MCV 87 01/21/2022   PLT 261 01/21/2022      Component Value Date/Time   NA 138 01/21/2022 1042   K 4.5 01/21/2022 1042   CL 104 01/21/2022 1042   CO2 22 01/21/2022 1042   GLUCOSE 93 01/21/2022 1042   GLUCOSE 117 (H) 03/06/2021 0558   BUN 7 01/21/2022 1042   CREATININE 0.74 01/21/2022 1042   CALCIUM 9.3 01/21/2022 1042   PROT 7.0 01/21/2022 1042    ALBUMIN 4.3 01/21/2022 1042   AST 12 01/21/2022 1042   ALT 12 01/21/2022 1042   ALKPHOS 79 01/21/2022 1042   BILITOT <0.2 01/21/2022 1042   GFRNONAA >60 03/06/2021 0558   No results found for: "CHOL", "HDL", "LDLCALC", "LDLDIRECT", "TRIG" No results found for: "HGBA1C" No results found for: "VITAMINB12" No results found for: "TSH"   ASSESSMENT AND PLAN  31 y.o. year old female  with history of asthma, PE who is presenting with new onset seizure and new onset headache since July 10.  She denies any seizure risk factors and her seizure was described as generalized convulsion with tongue biting.  She does have a history of PE therefore, I want to rule out venous sinus thrombosis by obtaining MRV  and also MRI brain.  I will also obtain a routine EEG.  I will contact the patient to go over the results.  We discussed driving restriction for the next 6 months.  All other questions answered.  I will see her in 6 months for follow-up    1. Seizure disorder (HCC)   2. New onset headache     Patient Instructions  MRI Brain with and without contrast  MRV Head  Routine EEG  Driving restriction for the next 6 months  Follow up in 6 months or sooner if worse    Per [facility] statutes, patients with seizures are not allowed to drive until they have been seizure-free for six months.  Other recommendations include using caution when using heavy equipment or power tools. Avoid working on ladders or at heights. Take showers instead of baths.  Do not swim alone.  Ensure the water temperature is not too high on the home water heater. Do not go swimming alone. Do not lock yourself in a room alone (i.e. bathroom). When caring for infants or small children, sit down when holding, feeding, or changing them to minimize risk of injury to the child in the event you have a seizure. Maintain good sleep hygiene. Avoid alcohol.  Also recommend adequate sleep, hydration, good diet and minimize  stress.   During the Seizure  - First, ensure adequate ventilation and place patients on the floor on their left side  Loosen clothing around the neck and ensure the airway is patent. If the patient is clenching the teeth, do not force the mouth open with any object as this can cause severe damage - Remove all items from the surrounding that can be hazardous. The patient may be oblivious to what's happening and may not even know what he or she is doing. If the patient is confused and wandering, either gently guide him/her away and block access to outside areas - Reassure the individual and be comforting - Call 911. In most cases, the seizure ends before EMS arrives. However, there are cases when seizures may last over 3 to 5 minutes. Or the individual may have developed breathing difficulties or severe injuries. If a pregnant patient or a person with diabetes develops a seizure, it is prudent to call an ambulance. - Finally, if the patient does not regain full consciousness, then call EMS. Most patients will remain confused for about 45 to 90 minutes after a seizure, so you must use judgment in calling for help. - Avoid restraints but make sure the patient is in a bed with padded side rails - Place the individual in a lateral position with the neck slightly flexed; this will help the saliva drain from the mouth and prevent the tongue from falling backward - Remove all nearby furniture and other hazards from the area - Provide verbal assurance as the individual is regaining consciousness - Provide the patient with privacy if possible - Call for help and start treatment as ordered by the caregiver   After the Seizure (Postictal Stage)  After a seizure, most patients experience confusion, fatigue, muscle pain and/or a headache. Thus, one should permit the individual to sleep. For the next few days, reassurance is essential. Being calm and helping reorient the person is also of importance.  Most  seizures are painless and end spontaneously. Seizures are not harmful to others but can lead to complications such as stress on the lungs, brain and the heart. Individuals with prior lung problems may  develop labored breathing and respiratory distress.     Orders Placed This Encounter  Procedures   MR MRV HEAD WO CM   MR BRAIN W WO CONTRAST   EEG adult    No orders of the defined types were placed in this encounter.   Return in about 6 months (around 08/17/2022).    Windell Norfolk, MD 02/14/2022, 8:37 AM  [facility] [address] [place], [place] [address] [phone]

## 2022-02-15 ENCOUNTER — Ambulatory Visit (HOSPITAL_COMMUNITY)
Admission: RE | Admit: 2022-02-15 | Discharge: 2022-02-15 | Disposition: A | Discharge status: Home / Self Care | Payer: Medicaid Other | Source: Ambulatory Visit | Attending: Vascular Surgery | Admitting: Vascular Surgery

## 2022-02-15 ENCOUNTER — Ambulatory Visit (INDEPENDENT_AMBULATORY_CARE_PROVIDER_SITE_OTHER): Payer: Medicaid Other | Admitting: Vascular Surgery

## 2022-02-15 ENCOUNTER — Encounter: Payer: Self-pay | Admitting: Vascular Surgery

## 2022-02-15 VITALS — BP 131/81 | HR 73 | Temp 98.0°F | Resp 20 | Ht 67.0 in | Wt 289.0 lb

## 2022-02-15 DIAGNOSIS — I872 Venous insufficiency (chronic) (peripheral): Secondary | ICD-10-CM | POA: Diagnosis not present

## 2022-02-15 DIAGNOSIS — M79606 Pain in leg, unspecified: Secondary | ICD-10-CM

## 2022-02-18 ENCOUNTER — Encounter: Payer: Self-pay | Admitting: Gastroenterology

## 2022-02-18 ENCOUNTER — Ambulatory Visit (INDEPENDENT_AMBULATORY_CARE_PROVIDER_SITE_OTHER): Payer: Medicaid Other

## 2022-02-18 DIAGNOSIS — I088 Other rheumatic multiple valve diseases: Secondary | ICD-10-CM

## 2022-02-18 DIAGNOSIS — R0600 Dyspnea, unspecified: Secondary | ICD-10-CM | POA: Diagnosis not present

## 2022-02-18 DIAGNOSIS — Z86711 Personal history of pulmonary embolism: Secondary | ICD-10-CM

## 2022-02-18 DIAGNOSIS — I517 Cardiomegaly: Secondary | ICD-10-CM

## 2022-02-18 LAB — ECHOCARDIOGRAM COMPLETE
Area-P 1/2: 4.6 cm2
S' Lateral: 2.44 cm

## 2022-02-19 ENCOUNTER — Ambulatory Visit: Payer: Medicaid Other | Admitting: Obstetrics and Gynecology

## 2022-02-20 ENCOUNTER — Ambulatory Visit (INDEPENDENT_AMBULATORY_CARE_PROVIDER_SITE_OTHER): Payer: Medicaid Other | Admitting: Neurology

## 2022-02-20 DIAGNOSIS — G40909 Epilepsy, unspecified, not intractable, without status epilepticus: Secondary | ICD-10-CM

## 2022-02-20 DIAGNOSIS — R519 Headache, unspecified: Secondary | ICD-10-CM

## 2022-02-20 NOTE — Procedures (Signed)
    History:  31 year old woman with new onset seizure   EEG classification:  Awake and asleep  Description of the recording: The background rhythms of this recording consists of a fairly well modulated medium amplitude background activity of 10-11 Hz. As the record progresses, the patient initially is in the waking state, but appears to enter the early stage II sleep during the recording, with rudimentary sleep spindles and vertex sharp wave activity seen. During the wakeful state, photic stimulation is performed, and no abnormal responses were seen. Hyperventilation was also performed, no abnormal response seen. No epileptiform discharges seen during this recording. There was no focal slowing. EKG monitor shows no evidence of cardiac rhythm abnormalities with a heart rate of 72.  Abnormality: None   Impression: This is a normal EEG recording in the waking and sleeping state. No evidence interictal epileptiform discharges were seen at any time during the recording.  A normal EEG does not exclude a diagnosis of epilepsy.    Windell Norfolk, MD [facility]

## 2022-02-26 NOTE — Progress Notes (Signed)
Spoke with pt and notified of results per Dr.McQuaid. Pt verbalized understanding and denied any questions. 

## 2022-02-27 ENCOUNTER — Telehealth: Payer: Self-pay | Admitting: Neurology

## 2022-02-27 NOTE — Telephone Encounter (Signed)
[employer] auth: [id number] exp. 02/21/22-04/22/22 sent to GI

## 2022-03-08 DIAGNOSIS — Z419 Encounter for procedure for purposes other than remedying health state, unspecified: Secondary | ICD-10-CM | POA: Diagnosis not present

## 2022-03-10 ENCOUNTER — Other Ambulatory Visit: Payer: Medicaid Other

## 2022-03-22 ENCOUNTER — Encounter: Payer: Self-pay | Admitting: Gastroenterology

## 2022-03-22 ENCOUNTER — Ambulatory Visit (INDEPENDENT_AMBULATORY_CARE_PROVIDER_SITE_OTHER): Payer: Medicaid Other | Admitting: Gastroenterology

## 2022-03-22 VITALS — BP 120/84 | HR 87 | Ht 66.0 in | Wt 290.1 lb

## 2022-03-22 DIAGNOSIS — K8020 Calculus of gallbladder without cholecystitis without obstruction: Secondary | ICD-10-CM | POA: Diagnosis not present

## 2022-03-22 DIAGNOSIS — Z6841 Body Mass Index (BMI) 40.0 and over, adult: Secondary | ICD-10-CM | POA: Diagnosis not present

## 2022-03-22 NOTE — Patient Instructions (Signed)
_______________________________________________________  If you are age 32 or older, your body mass index should be between 23-30. Your Body mass index is 46.82 kg/m. If this is out of the aforementioned range listed, please consider follow up with your Primary Care Provider.  If you are age 33 or younger, your body mass index should be between 19-25. Your Body mass index is 46.82 kg/m. If this is out of the aformentioned range listed, please consider follow up with your Primary Care Provider.   Please contact us or Primary care if you pain related to gallstones.  ________________________________________________________  The [facility] providers would like to encourage you to use [facility] to communicate with providers for non-urgent requests or questions.  Due to long hold times on the telephone, sending your provider a message by [facility] may be a faster and more efficient way to get a response.  Please allow 48 business hours for a response.  Please remember that this is for non-urgent requests.  _______________________________________________________  It was a pleasure to see you today!  Thank you for trusting me with your gastrointestinal care!

## 2022-03-22 NOTE — Progress Notes (Signed)
HPI : Debra Bell is a very pleasant 31 year old female with a history of obesity, asthma and pulmonary embolism who is referred to [place] by Marisue Brooklyn, NP for further evaluation and management of cholelithiasis.  Patient was noted to have gallstones incidentally on a CT scan in August, which was performed to evaluate for recurrent pulmonary emboli.  The patient had been complaining of shortness of breath, and given her history of PE, a repeat CTA was performed. The patient did report having issues with abdominal pain for about a month in July.  The pain was located in the lower abdomen, bilaterally, sometimes going up to the left upper quadrant.  She was having this pain daily for about a month.  It would last at most 2 hours at a time.  It was not associated with meals or with eating.  She had nausea but no vomiting she was having difficulty with constipation and straining with bowel movements.  She admits that her diet has been fairly poor during this time.  This abdominal pain has since resolved.  Her bowel movements are back to normal, with soft stools which are easy to pass.  No blood in the stool.  No nausea currently.  She denies any symptoms consistent with classic biliary colic (postprandial pain in the right upper quadrant, colicky in nature, radiating to scapula).  She has no family history of GI malignancy.  Her maternal aunt had to have her gallbladder taken out, otherwise no family history of symptomatic cholelithiasis.   Past Medical History:  Diagnosis Date   Asthma    Bilateral ovarian cysts    History of ovarian cyst    Hx of partial seizures    Pulmonary emboli (HCC)      Past Surgical History:  Procedure Laterality Date   CESAREAN SECTION     CESAREAN SECTION N/A 02/05/2021   Procedure: CESAREAN SECTION;  Surgeon: Tereso Newcomer, MD;  Location: [facility] LD ORS;  Service: Obstetrics;  Laterality: N/A;   OVARIAN CYST REMOVAL  2015   and 2013   No family history on  file. Social History   Tobacco Use   Smoking status: Never    Passive exposure: Never   Smokeless tobacco: Never  Vaping Use   Vaping Use: Never used  Substance Use Topics   Alcohol use: Never   Drug use: Never   Current Outpatient Medications  Medication Sig Dispense Refill   ibuprofen (ADVIL) 600 MG tablet Take 1 tablet (600 mg total) by mouth every 8 (eight) hours as needed for headache. 30 tablet 2   No current facility-administered medications for this visit.   No Known Allergies   Review of Systems: All systems reviewed and negative except where noted in HPI.    EEG adult  Result Date: 02/20/2022 Windell Norfolk, MD     02/20/2022  5:32 PM History: 31 year old woman with new onset seizure EEG classification:  Awake and asleep Description of the recording: The background rhythms of this recording consists of a fairly well modulated medium amplitude background activity of 10-11 Hz. As the record progresses, the patient initially is in the waking state, but appears to enter the early stage II sleep during the recording, with rudimentary sleep spindles and vertex sharp wave activity seen. During the wakeful state, photic stimulation is performed, and no abnormal responses were seen. Hyperventilation was also performed, no abnormal response seen. No epileptiform discharges seen during this recording. There was no focal slowing. EKG monitor shows  no evidence of cardiac rhythm abnormalities with a heart rate of 72. Abnormality: None Impression: This is a normal EEG recording in the waking and sleeping state. No evidence interictal epileptiform discharges were seen at any time during the recording.  A normal EEG does not exclude a diagnosis of epilepsy. Alric Ran, MD [facility]    Physical Exam: BP 120/84   Pulse 87   Ht 5\' 6"  (1.676 m)   Wt 290 lb 1.6 oz (131.6 kg)   BMI 46.82 kg/m  Constitutional: Pleasant,well-developed, obese African-American female in no  acute distress. HEENT: Normocephalic and atraumatic. Conjunctivae are normal. No scleral icterus. Cardiovascular: Normal rate, regular rhythm.  Pulmonary/chest: Effort normal and breath sounds normal. No wheezing, rales or rhonchi. Abdominal: Soft, nondistended, nontender. Bowel sounds active throughout. There are no masses palpable. No hepatomegaly. Extremities: no edema Neurological: Alert and oriented to person place and time. Skin: Skin is warm and dry. No rashes noted. Psychiatric: Normal mood and affect. Behavior is normal.  CBC    Component Value Date/Time   WBC 4.9 01/21/2022 1042   WBC 9.8 03/06/2021 0558   RBC 4.89 01/21/2022 1042   RBC 4.02 03/06/2021 0558   HGB 13.3 01/21/2022 1042   HCT 42.3 01/21/2022 1042   PLT 261 01/21/2022 1042   MCV 87 01/21/2022 1042   MCH 27.2 01/21/2022 1042   MCH 26.6 03/06/2021 0558   MCHC 31.4 (L) 01/21/2022 1042   MCHC 30.1 03/06/2021 0558   RDW 13.0 01/21/2022 1042   LYMPHSABS 2.1 03/05/2021 1823   LYMPHSABS 2.3 09/12/2020 1450   MONOABS 0.8 03/05/2021 1823   EOSABS 0.0 03/05/2021 1823   EOSABS 0.0 09/12/2020 1450   BASOSABS 0.0 03/05/2021 1823   BASOSABS 0.0 09/12/2020 1450    CMP     Component Value Date/Time   NA 138 01/21/2022 1042   K 4.5 01/21/2022 1042   CL 104 01/21/2022 1042   CO2 22 01/21/2022 1042   GLUCOSE 93 01/21/2022 1042   GLUCOSE 117 (H) 03/06/2021 0558   BUN 7 01/21/2022 1042   CREATININE 0.74 01/21/2022 1042   CALCIUM 9.3 01/21/2022 1042   PROT 7.0 01/21/2022 1042   ALBUMIN 4.3 01/21/2022 1042   AST 12 01/21/2022 1042   ALT 12 01/21/2022 1042   ALKPHOS 79 01/21/2022 1042   BILITOT <0.2 01/21/2022 1042   GFRNONAA >60 03/06/2021 0558     ASSESSMENT AND PLAN: 31 year old female with incidentally noted gallstones on a CT-PA.  She does not have any typical symptoms of symptomatic cholelithiasis.  She did have a period of lower abdominal pain for a month in the setting of constipation.  The symptoms  seem atypical for symptomatic cholelithiasis.  I would not recommend that the patient undergo a cholecystectomy based on the absence of clear symptoms attributed to her gallstones.  I did educate the patient on symptoms to be aware of, and if she is having recurrent abdominal pain or nausea, then I would recommend she would be referred to a general surgeon to consider cholecystectomy or perhaps do further testing such as HIDA scan prior to a cholecystectomy. We discussed the importance of weight loss for the patient, and the patient is aware of what she needs to do to lose weight, and is aware of the importance of weight loss for her health in the future.  Gallstones, likely asymptomatic - No further treatment recommended  Lower abdominal pain, constipation, resolved - Recommended to continue improvement in her diet and physical activity  Obesity - Counseled, patient motivated to lose weight  Lailana Shira E. Tomasa Rand, MD [facility]   CC:  Rema Fendt, NP

## 2022-03-23 DIAGNOSIS — K8020 Calculus of gallbladder without cholecystitis without obstruction: Secondary | ICD-10-CM

## 2022-03-23 HISTORY — DX: Calculus of gallbladder without cholecystitis without obstruction: K80.20

## 2022-03-26 NOTE — Progress Notes (Deleted)
   Synopsis: Referred in August 2023 for small subsegmental pulmonary embolism diagnosed in August 2022 when she was hospitalized for pre-eclampsia.   Subjective:   PATIENT ID: Debra Bell GENDER: female DOB: 1991-04-05, MRN: [id number]   HPI  No chief complaint on file.  Follow-up CT angiogram and echo ***  Past Medical History:  Diagnosis Date   Asthma    Bilateral ovarian cysts    History of ovarian cyst    Hx of partial seizures    Pulmonary emboli (HCC)       Review of Systems  Constitutional:  Negative for chills, fever, malaise/fatigue and weight loss.  HENT:  Negative for congestion, nosebleeds, sinus pain and sore throat.   Eyes:  Negative for photophobia, pain and discharge.  Respiratory:  Positive for shortness of breath. Negative for cough, hemoptysis, sputum production and wheezing.   Cardiovascular:  Positive for chest pain. Negative for palpitations, orthopnea and leg swelling.  Gastrointestinal:  Negative for abdominal pain, constipation, diarrhea, nausea and vomiting.  Genitourinary:  Negative for dysuria, frequency, hematuria and urgency.  Musculoskeletal:  Negative for back pain, joint pain, myalgias and neck pain.  Skin:  Negative for itching and rash.  Neurological:  Negative for tingling, tremors, sensory change, speech change, focal weakness, seizures, weakness and headaches.  Psychiatric/Behavioral:  Negative for memory loss, substance abuse and suicidal ideas (.dbmex). The patient is not nervous/anxious.       Objective:  Physical Exam   There were no vitals filed for this visit.   ***   CBC    Component Value Date/Time   WBC 4.9 01/21/2022 1042   WBC 9.8 03/06/2021 0558   RBC 4.89 01/21/2022 1042   RBC 4.02 03/06/2021 0558   HGB 13.3 01/21/2022 1042   HCT 42.3 01/21/2022 1042   PLT 261 01/21/2022 1042   MCV 87 01/21/2022 1042   MCH 27.2 01/21/2022 1042   MCH 26.6 03/06/2021 0558   MCHC 31.4 (L) 01/21/2022 1042   MCHC  30.1 03/06/2021 0558   RDW 13.0 01/21/2022 1042   LYMPHSABS 2.1 03/05/2021 1823   LYMPHSABS 2.3 09/12/2020 1450   MONOABS 0.8 03/05/2021 1823   EOSABS 0.0 03/05/2021 1823   EOSABS 0.0 09/12/2020 1450   BASOSABS 0.0 03/05/2021 1823   BASOSABS 0.0 09/12/2020 1450     Chest imaging: August 2022 CT angiogram chest images independently reviewed showing segmental filling defect in the right lower lobe pulmonary artery but the remainder of the pulmonary vasculature is open, nonspecific groundglass opacification in the bases bilaterally in a dependent distribution August 2023 CT angiogram chest showed no pulmonary embolism, images independently reviewed showing normal pulmonary parenchyma, cholelithiasis mentioned  PFT:  Labs:  Path:  Echo: August 2023 TTE LVEF 59%, right ventricle function normal, RV chamber size mildly enlarged.  Heart Catheterization:       Assessment & Plan:   No diagnosis found.  Discussion: *** V/Q, increased size of RV, sleep study?   Current Outpatient Medications:    ibuprofen (ADVIL) 600 MG tablet, Take 1 tablet (600 mg total) by mouth every 8 (eight) hours as needed for headache., Disp: 30 tablet, Rfl: 2

## 2022-03-29 ENCOUNTER — Ambulatory Visit: Payer: Medicaid Other | Admitting: Pulmonary Disease

## 2022-03-29 DIAGNOSIS — R0600 Dyspnea, unspecified: Secondary | ICD-10-CM

## 2022-03-29 DIAGNOSIS — Z86711 Personal history of pulmonary embolism: Secondary | ICD-10-CM

## 2022-04-07 DIAGNOSIS — Z419 Encounter for procedure for purposes other than remedying health state, unspecified: Secondary | ICD-10-CM | POA: Diagnosis not present

## 2022-05-08 DIAGNOSIS — Z419 Encounter for procedure for purposes other than remedying health state, unspecified: Secondary | ICD-10-CM | POA: Diagnosis not present

## 2022-05-15 ENCOUNTER — Encounter: Payer: Self-pay | Admitting: Obstetrics

## 2022-05-15 ENCOUNTER — Other Ambulatory Visit (HOSPITAL_COMMUNITY)
Admission: RE | Admit: 2022-05-15 | Discharge: 2022-05-15 | Disposition: A | Discharge status: Home / Self Care | Payer: Medicaid Other | Source: Ambulatory Visit | Attending: Obstetrics | Admitting: Obstetrics

## 2022-05-15 ENCOUNTER — Ambulatory Visit (INDEPENDENT_AMBULATORY_CARE_PROVIDER_SITE_OTHER): Payer: Medicaid Other | Admitting: Obstetrics

## 2022-05-15 VITALS — BP 125/82 | HR 85 | Ht 66.0 in | Wt 292.5 lb

## 2022-05-15 DIAGNOSIS — N946 Dysmenorrhea, unspecified: Secondary | ICD-10-CM | POA: Diagnosis not present

## 2022-05-15 DIAGNOSIS — Z01419 Encounter for gynecological examination (general) (routine) without abnormal findings: Secondary | ICD-10-CM | POA: Insufficient documentation

## 2022-05-15 DIAGNOSIS — N898 Other specified noninflammatory disorders of vagina: Secondary | ICD-10-CM | POA: Diagnosis not present

## 2022-05-15 DIAGNOSIS — Z6841 Body Mass Index (BMI) 40.0 and over, adult: Secondary | ICD-10-CM | POA: Diagnosis not present

## 2022-05-15 DIAGNOSIS — Z3009 Encounter for other general counseling and advice on contraception: Secondary | ICD-10-CM

## 2022-05-15 LAB — POCT URINE PREGNANCY: Preg Test, Ur: NEGATIVE

## 2022-05-15 MED ORDER — METRONIDAZOLE 500 MG PO TABS: 500.0000 mg | ORAL_TABLET | Freq: Two times a day (BID) | ORAL | 2 refills | Status: DC

## 2022-05-15 MED ORDER — IBUPROFEN 800 MG PO TABS: 800.0000 mg | ORAL_TABLET | Freq: Three times a day (TID) | ORAL | 5 refills | Status: DC | PRN

## 2022-05-15 NOTE — Progress Notes (Signed)
Pt is in the office for annual Last pap 09/12/20 Pt wants to discuss [facility] pills Reports vaginal discharge today LMP 04/20/22

## 2022-05-15 NOTE — Progress Notes (Signed)
Subjective:        Debra Bell is a 31 y.o. female here for a routine exam.  Current complaints: Malodorous vaginal discharge.    Personal health questionnaire:  Is patient Ashkenazi Jewish, have a family history of breast and/or ovarian cancer: no Is there a family history of uterine cancer diagnosed at age < 39, gastrointestinal cancer, urinary tract cancer, family member who is a Field seismologist syndrome-associated carrier: no Is the patient overweight and hypertensive, family history of diabetes, personal history of gestational diabetes, preeclampsia or PCOS: no Is patient over 77, have PCOS,  family history of premature CHD under age 71, diabetes, smoke, have hypertension or peripheral artery disease:  no At any time, has a partner hit, kicked or otherwise hurt or frightened you?: no Over the past 2 weeks, have you felt down, depressed or hopeless?: no Over the past 2 weeks, have you felt little interest or pleasure in doing things?:no   Gynecologic History Patient's last menstrual period was 04/20/2022. Contraception: none Last Pap: 2022. Results were: normal Last mammogram: n/a. Results were: n/a  Obstetric History OB History  Gravida Para Term Preterm AB Living  4 3 3   1 3   SAB IAB Ectopic Multiple Live Births    1   0 3    # Outcome Date GA Lbr Len/2nd Weight Sex Delivery Anes PTL Lv  4 Term 02/05/21 [redacted]w[redacted]d  6 lb 7.7 oz (2.94 kg) M CS-LTranv Spinal  LIV  3 IAB           2 Term     M CS-LTranv   LIV  1 Term     M CS-LTranv   LIV    Past Medical History:  Diagnosis Date   Asthma    Bilateral ovarian cysts    Gallstones 03/23/2022   History of ovarian cyst    Hx of partial seizures    Pulmonary emboli ([place])     Past Surgical History:  Procedure Laterality Date   CESAREAN SECTION     CESAREAN SECTION N/A 02/05/2021   Procedure: CESAREAN SECTION;  Surgeon: Osborne Oman, MD;  Location: [facility];  Service: Obstetrics;  Laterality: N/A;   OVARIAN CYST  REMOVAL  2015   and 2013     Current Outpatient Medications:    ibuprofen (ADVIL) 800 MG tablet, Take 1 tablet (800 mg total) by mouth every 8 (eight) hours as needed., Disp: 30 tablet, Rfl: 5   metroNIDAZOLE (FLAGYL) 500 MG tablet, Take 1 tablet (500 mg total) by mouth 2 (two) times daily., Disp: 14 tablet, Rfl: 2 No Known Allergies  Social History   Tobacco Use   Smoking status: Never    Passive exposure: Never   Smokeless tobacco: Never  Substance Use Topics   Alcohol use: Never    Family History  Problem Relation Age of Onset   Ovarian cancer Maternal Grandmother       Review of Systems  Constitutional: negative for fatigue and weight loss Respiratory: negative for cough and wheezing Cardiovascular: negative for chest pain, fatigue and palpitations Gastrointestinal: negative for abdominal pain and change in bowel habits Musculoskeletal:negative for myalgias Neurological: negative for gait problems and tremors Behavioral/Psych: negative for abusive relationship, depression Endocrine: negative for temperature intolerance    Genitourinary:positive for malodorous vaginal discharge.  negative for abnormal menstrual periods, genital lesions, hot flashes, sexual problems  Integument/breast: negative for breast lump, breast tenderness, nipple discharge and skin lesion(s)    Objective:  BP 125/82   Pulse 85   Ht 5\' 6"  (1.676 m)   Wt 292 lb 8 oz (132.7 kg)   LMP 04/20/2022   Breastfeeding No   BMI 47.21 kg/m  General:   Alert and no distress  Skin:   no rash or abnormalities  Lungs:   clear to auscultation bilaterally  Heart:   regular rate and rhythm, S1, S2 normal, no murmur, click, rub or gallop  Breasts:   normal without suspicious masses, skin or nipple changes or axillary nodes  Abdomen:  normal findings: no organomegaly, soft, non-tender and no hernia  Pelvis:  External genitalia: normal general appearance Urinary system: urethral meatus normal and bladder  without fullness, nontender Vaginal: normal without tenderness, induration or masses Cervix: normal appearance Adnexa: normal bimanual exam Uterus: anteverted and non-tender, normal size   Lab Review Urine pregnancy test Labs reviewed yes Radiologic studies reviewed yes  I have spent a total of 20 minutes of face-to-face time, excluding clinical staff time, reviewing notes and preparing to see patient, ordering tests and/or medications, and counseling the patient.   Assessment:    1. Encounter for routine gynecological examination with Papanicolaou smear of cervix Rx: - Cytology - PAP( [facility]) - POCT urine pregnancy  2. Vaginal discharge Rx: - Cervicovaginal ancillary only( ) - metroNIDAZOLE (FLAGYL) 500 MG tablet; Take 1 tablet (500 mg total) by mouth 2 (two) times daily.  Dispense: 14 tablet; Refill: 2  3. Encounter for other general counseling and advice on contraception - contraceptive options discussed.  Has history of pulmonary embolism.  Non-hormonal form of contraception discussed  4. Dysmenorrhea Rx: - ibuprofen (ADVIL) 800 MG tablet; Take 1 tablet (800 mg total) by mouth every 8 (eight) hours as needed.  Dispense: 30 tablet; Refill: 5  5. Class 3 severe obesity due to excess calories without serious comorbidity with body mass index (BMI) of 45.0 to 49.9 in adult (HCC) - weight reduction recommended     Plan:    Education reviewed: calcium supplements, depression evaluation, low fat, low cholesterol diet, safe sex/STD prevention, self breast exams, and weight bearing exercise. Contraception: tubal ligation. Follow up in: a few months. Considering tubal sterilization  Meds ordered this encounter  Medications   metroNIDAZOLE (FLAGYL) 500 MG tablet    Sig: Take 1 tablet (500 mg total) by mouth 2 (two) times daily.    Dispense:  14 tablet    Refill:  2   ibuprofen (ADVIL) 800 MG tablet    Sig: Take 1 tablet (800 mg total) by mouth every 8 (eight)  hours as needed.    Dispense:  30 tablet    Refill:  5   Orders Placed This Encounter  Procedures   POCT urine pregnancy    04/22/2022, MD 05/15/2022 9:27 AM

## 2022-05-16 ENCOUNTER — Telehealth: Payer: Self-pay | Admitting: Emergency Medicine

## 2022-05-16 ENCOUNTER — Other Ambulatory Visit: Payer: Self-pay | Admitting: Obstetrics

## 2022-05-16 LAB — CERVICOVAGINAL ANCILLARY ONLY
Bacterial Vaginitis (gardnerella): POSITIVE — AB
Candida Glabrata: NEGATIVE
Candida Vaginitis: NEGATIVE
Chlamydia: NEGATIVE
Comment: NEGATIVE
Comment: NEGATIVE
Comment: NEGATIVE
Comment: NEGATIVE
Comment: NEGATIVE
Comment: NORMAL
Neisseria Gonorrhea: NEGATIVE
Trichomonas: NEGATIVE

## 2022-05-16 LAB — CYTOLOGY - PAP
Comment: NEGATIVE
Diagnosis: NEGATIVE
High risk HPV: NEGATIVE

## 2022-05-16 NOTE — Telephone Encounter (Signed)
TC to patient to discuss results, Rx 

## 2022-06-04 ENCOUNTER — Ambulatory Visit (INDEPENDENT_AMBULATORY_CARE_PROVIDER_SITE_OTHER): Payer: Medicaid Other | Admitting: Obstetrics and Gynecology

## 2022-06-04 ENCOUNTER — Encounter: Payer: Self-pay | Admitting: Obstetrics and Gynecology

## 2022-06-04 VITALS — BP 109/76 | HR 80 | Wt 301.0 lb

## 2022-06-04 DIAGNOSIS — Z3009 Encounter for other general counseling and advice on contraception: Secondary | ICD-10-CM | POA: Diagnosis not present

## 2022-06-04 NOTE — Progress Notes (Signed)
Pt is here to discuss BTL.  Pt states she has health concerns and does not wish to have any more children.

## 2022-06-04 NOTE — Patient Instructions (Signed)

## 2022-06-04 NOTE — Progress Notes (Signed)
Debra Bell presents to discuss contraception vs tubal Pt desires contraception but has been advised to have tubal. Pt with complicated OB history, to include placental abruption, PE and eclampisa. Pt is morbid obese and has H/O HTN  She has completed 6 months of anticoagulation for her H/O PE and is currently on no Sz medications.   BP is normal without meds  PE AF VSS Lungs clear Heart RRR Abd soft + BS obese  A/P Contraception management  Pt does not want a tubal at this time but is very interested in contraception Paragard IUD recommended and reviewed with pt. Information provided as well. Pt will return with next cycle for placement

## 2022-06-07 DIAGNOSIS — Z419 Encounter for procedure for purposes other than remedying health state, unspecified: Secondary | ICD-10-CM | POA: Diagnosis not present

## 2022-06-19 ENCOUNTER — Ambulatory Visit: Payer: Medicaid Other | Admitting: Obstetrics and Gynecology

## 2022-07-08 DIAGNOSIS — Z419 Encounter for procedure for purposes other than remedying health state, unspecified: Secondary | ICD-10-CM | POA: Diagnosis not present

## 2022-08-07 ENCOUNTER — Ambulatory Visit: Payer: Medicaid Other | Admitting: Obstetrics and Gynecology

## 2022-08-07 VITALS — BP 111/76 | HR 105 | Wt 284.0 lb

## 2022-08-07 NOTE — Progress Notes (Unsigned)
Pt is in office for Paragard inserion.  Pt states LMP 07/20/22, last intercourse 08/05/22.

## 2022-08-08 DIAGNOSIS — Z419 Encounter for procedure for purposes other than remedying health state, unspecified: Secondary | ICD-10-CM | POA: Diagnosis not present

## 2022-08-12 IMAGING — US US MFM OB FOLLOW-UP
1 series · 14 of 28 positions shown · non-contrast
Comparison: none

[Series 1: us mfm ob follow-up · 54 acquisitions, 14 frames shown]
[im 2/54]
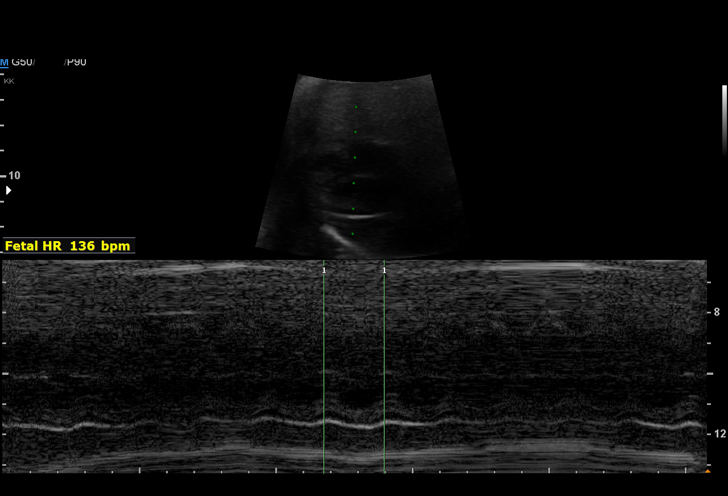
[im 6/54]
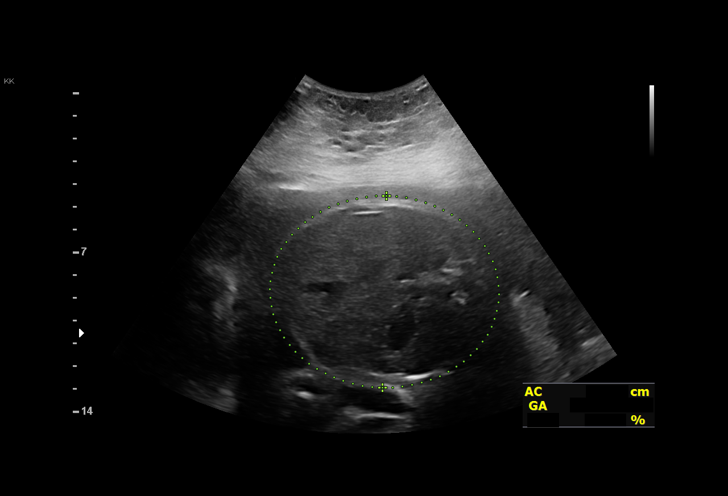
[im 10/54]
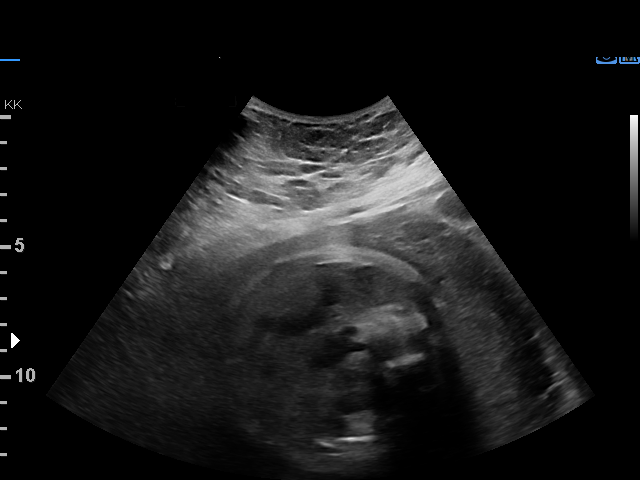
[im 14/54]
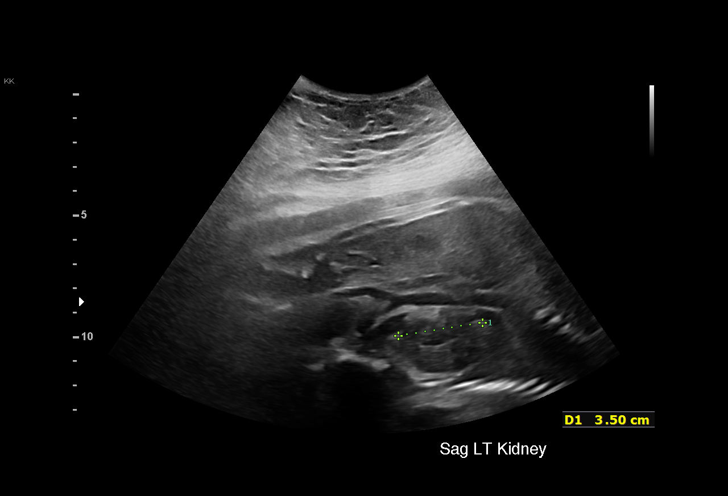
[im 18/54]
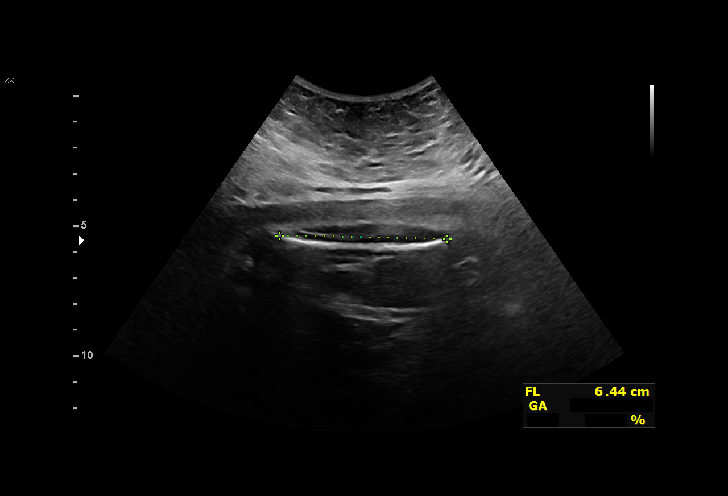
[im 22/54]
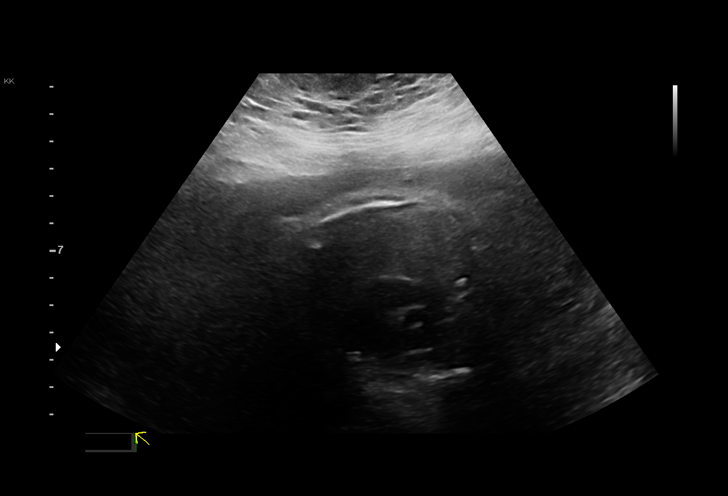
[im 26/54]
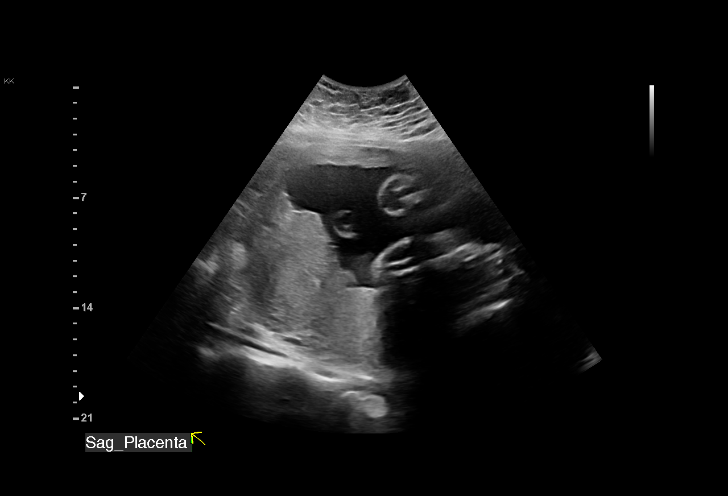
[im 30/54]
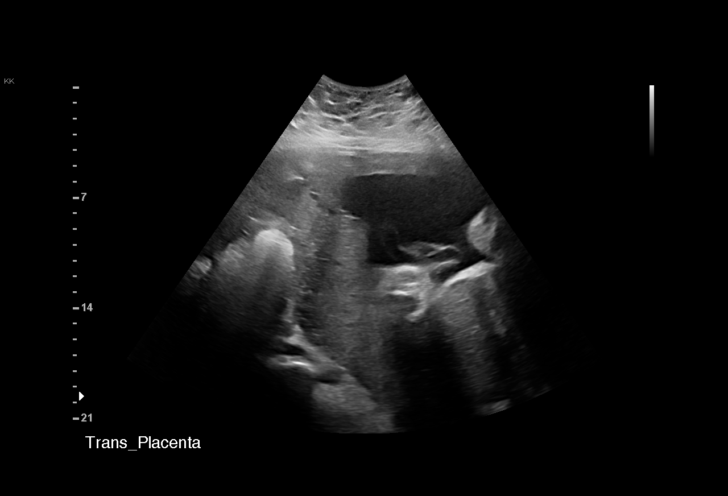
[im 34/54]
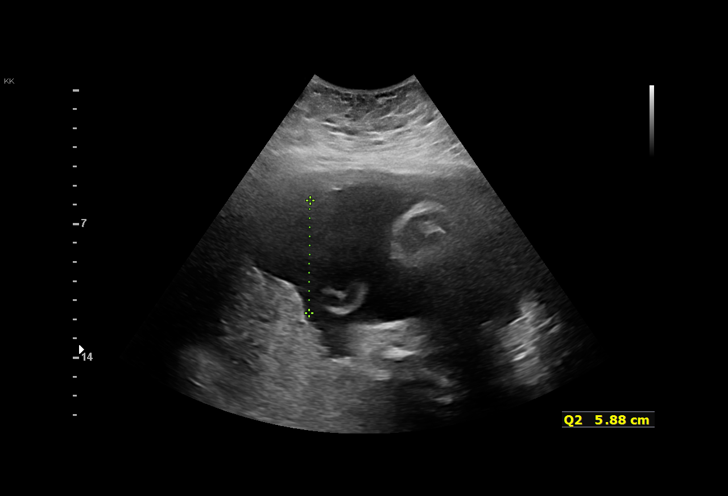
[im 38/54]
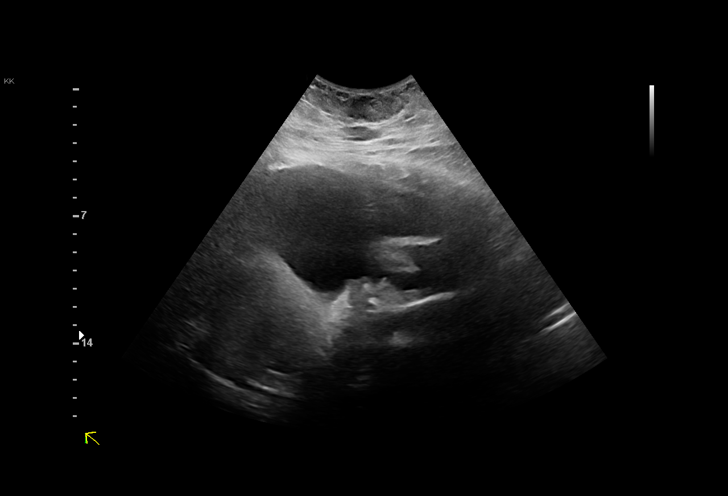
[im 42/54]
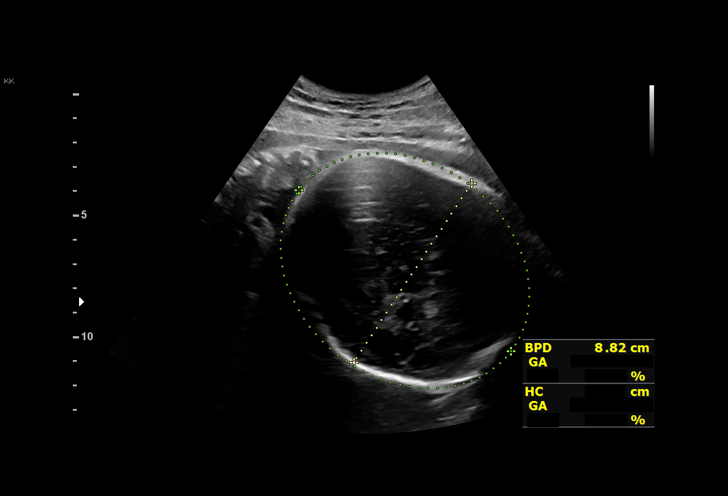
[im 46/54]
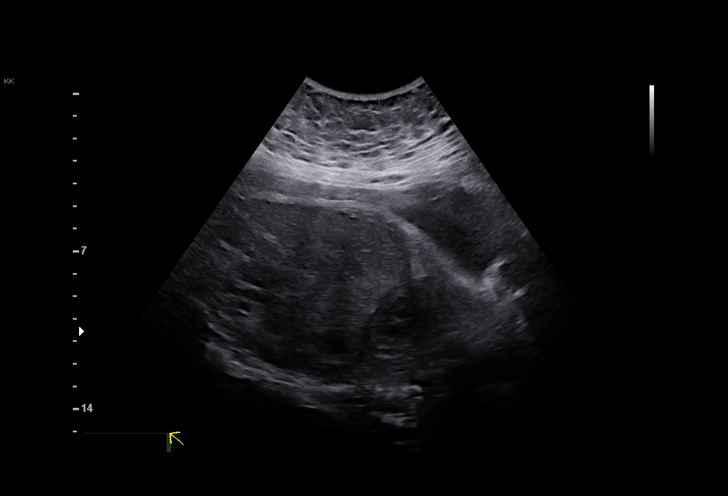
[im 50/54]
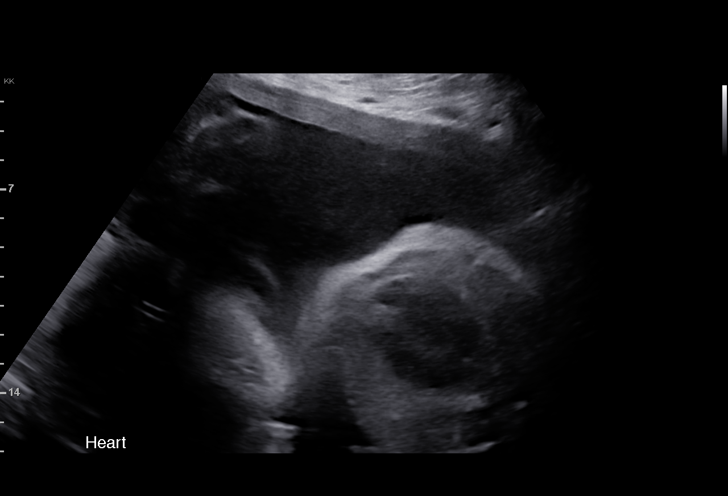
[im 54/54]
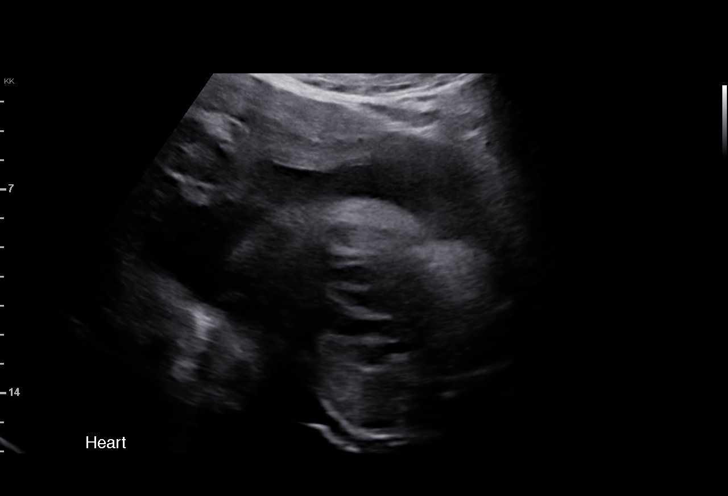

[14 of 28 positions shown; findings below may reference images not displayed]

Indications

 33 weeks gestation of pregnancy
 Obesity complicating pregnancy, third
 trimester (BMI 40)
 Genetic carrier (Silent Azeneth Pepin)
 Previous cesarean delivery, antepartum x2
 Encounter for other antenatal screening
 follow-up
 Low Risk NIPS(Negative AFP)
Fetal Evaluation

 Num Of Fetuses:         1
 Fetal Heart Rate(bpm):  136
 Cardiac Activity:       Observed
 Presentation:           Cephalic
 Placenta:               Posterior
 P. Cord Insertion:      Previously Visualized

 Amniotic Fluid
 AFI FV:      Within normal limits

 AFI Sum(cm)     %Tile       Largest Pocket(cm)
 12.6            38

 RUQ(cm)                     LUQ(cm)        LLQ(cm)

Biometry

 BPD:      88.3  mm     G. Age:  35w 5d         96  %    CI:        79.96   %    70 - 86
                                                         FL/HC:      20.8   %    19.9 -
 HC:       312   mm     G. Age:  34w 6d         56  %    HC/AC:      1.05        0.96 -
 AC:      297.1  mm     G. Age:  33w 5d         64  %    FL/BPD:     73.5   %    71 - 87
 FL:       64.9  mm     G. Age:  33w 3d         44  %    FL/AC:      21.8   %    20 - 24

 Est. FW:    1311  gm      5 lb 2 oz     62  %
OB History

 Gravidity:    4         Term:   2         SAB:   1
 Living:       2
Gestational Age

 LMP:           34w 4d        Date:  05/08/20                 EDD:   02/12/21
 U/S Today:     34w 3d                                        EDD:   02/13/21
 Best:          33w 2d     Det. By:  U/S  (09/12/20)          EDD:   02/21/21
Anatomy



 Other:  Male gender previously seen. Hands prev Technically difficult due to
         maternal habitus and fetal position. visualized.
Cervix Uterus Adnexa

 Cervix
 Not visualized (advanced GA >00wks)
Impression

 Maternal obesity.  Patient return for fetal growth assessment.
 She does not have gestational diabetes.  Blood pressure
 today at her office is 98/56 mmHg.
 Amniotic fluid is normal good fetal activity seen.  Fetal growth
 is appropriate for gestational age.

 We reassured the patient of the findings.
Recommendations

 -An appointment was made for her to return in 3 weeks for
 BPP and then weekly BPP till delivery.
 -Fetal growth assessment in 4 weeks.
                 Guest, Siul

## 2022-08-21 ENCOUNTER — Ambulatory Visit: Payer: Medicaid Other | Admitting: Obstetrics and Gynecology

## 2022-09-06 DIAGNOSIS — Z419 Encounter for procedure for purposes other than remedying health state, unspecified: Secondary | ICD-10-CM | POA: Diagnosis not present

## 2022-10-07 DIAGNOSIS — Z419 Encounter for procedure for purposes other than remedying health state, unspecified: Secondary | ICD-10-CM | POA: Diagnosis not present

## 2022-10-10 ENCOUNTER — Encounter (HOSPITAL_COMMUNITY): Payer: Self-pay

## 2022-10-10 ENCOUNTER — Ambulatory Visit (HOSPITAL_COMMUNITY)
Admission: EM | Admit: 2022-10-10 | Discharge: 2022-10-10 | Disposition: A | Discharge status: Home / Self Care | Payer: Medicaid Other | Attending: Family Medicine | Admitting: Family Medicine

## 2022-10-10 DIAGNOSIS — R070 Pain in throat: Secondary | ICD-10-CM | POA: Diagnosis not present

## 2022-10-10 DIAGNOSIS — J019 Acute sinusitis, unspecified: Secondary | ICD-10-CM

## 2022-10-10 LAB — POCT RAPID STREP A, ED / UC: Streptococcus, Group A Screen (Direct): NEGATIVE

## 2022-10-10 MED ORDER — CEFDINIR 300 MG PO CAPS: 600.0000 mg | ORAL_CAPSULE | Freq: Every day | ORAL | 0 refills | Status: AC

## 2022-10-10 MED ORDER — IBUPROFEN 800 MG PO TABS: 800.0000 mg | ORAL_TABLET | Freq: Three times a day (TID) | ORAL | 0 refills | Status: AC | PRN

## 2022-10-10 NOTE — ED Provider Notes (Signed)
[facility]    CSN: [id number] Arrival date & time: 10/10/22  1208      History   Chief Complaint Chief Complaint  Patient presents with   Sore Throat   Headache   Cough    HPI Debra Bell is a 32 y.o. female.    Sore Throat Associated symptoms include headaches.  Headache Associated symptoms: cough   Cough Associated symptoms: headaches    Here for left ear pain and congestion.  Symptoms began on March 26, 8 days ago.  She has not had any fever, and she developed some significant sore throat about 3 days ago.  She is also had some frontal headache, especially on the left.  No vomiting or diarrhea. Last menstrual cycle was April 3   Past Medical History:  Diagnosis Date   Asthma    Bilateral ovarian cysts    Gallstones 03/23/2022   History of ovarian cyst    Hx of partial seizures    Pulmonary emboli     Patient Active Problem List   Diagnosis Date Noted   Counseling for birth control regarding intrauterine device (IUD) 06/04/2022   History of pulmonary embolism 08/03/2021    Past Surgical History:  Procedure Laterality Date   CESAREAN SECTION     CESAREAN SECTION N/A 02/05/2021   Procedure: CESAREAN SECTION;  Surgeon: Osborne Oman, MD;  Location: [facility];  Service: Obstetrics;  Laterality: N/A;   OVARIAN CYST REMOVAL  2015   and 2013    OB History     Gravida  4   Para  3   Term  3   Preterm      AB  1   Living  3      SAB      IAB  1   Ectopic      Multiple  0   Live Births  3            Home Medications    Prior to Admission medications   Medication Sig Start Date End Date Taking? Authorizing Provider  cefdinir (OMNICEF) 300 MG capsule Take 2 capsules (600 mg total) by mouth daily for 7 days. 10/10/22 10/17/22 Yes Barrett Henle, MD  ibuprofen (ADVIL) 800 MG tablet Take 1 tablet (800 mg total) by mouth every 8 (eight) hours as needed (pain). 10/10/22  Yes Lissy Deuser, Gwenlyn Perking, MD    Family  History Family History  Problem Relation Age of Onset   Ovarian cancer Maternal Grandmother     Social History Social History   Tobacco Use   Smoking status: Never    Passive exposure: Never   Smokeless tobacco: Never  Vaping Use   Vaping Use: Never used  Substance Use Topics   Alcohol use: Never   Drug use: Not Currently    Types: Marijuana     Allergies   Patient has no known allergies.   Review of Systems Review of Systems  Respiratory:  Positive for cough.   Neurological:  Positive for headaches.     Physical Exam Triage Vital Signs ED Triage Vitals  Enc Vitals Group     BP 10/10/22 1234 (!) 142/96     Pulse Rate 10/10/22 1233 80     Resp 10/10/22 1233 16     Temp 10/10/22 1233 98.3 F (36.8 C)     Temp Source 10/10/22 1233 Oral     SpO2 10/10/22 1233 98 %     Weight --  Height --      Head Circumference --      Peak Flow --      Pain Score --      Pain Loc --      Pain Edu? --      Excl. in [place]? --    No data found.  Updated Vital Signs BP (!) 142/96 (BP Location: Left Arm)   Pulse 80   Temp 98.3 F (36.8 C) (Oral)   Resp 16   LMP 10/09/2022   SpO2 100%   Visual Acuity Right Eye Distance:   Left Eye Distance:   Bilateral Distance:    Right Eye Near:   Left Eye Near:    Bilateral Near:     Physical Exam Vitals reviewed.  Constitutional:      General: She is not in acute distress.    Appearance: She is not toxic-appearing or diaphoretic.  HENT:     Right Ear: Tympanic membrane and ear canal normal.     Left Ear: Tympanic membrane and ear canal normal.     Nose: Congestion present.     Mouth/Throat:     Mouth: Mucous membranes are moist.     Comments: There is yellow and white mucus draining in the oropharynx.  The tonsils are 1+ hypertrophy with mild erythema.  There is no asymmetry Eyes:     Extraocular Movements: Extraocular movements intact.     Conjunctiva/sclera: Conjunctivae normal.     Pupils: Pupils are equal,  round, and reactive to light.  Cardiovascular:     Rate and Rhythm: Normal rate and regular rhythm.     Heart sounds: No murmur heard. Pulmonary:     Effort: Pulmonary effort is normal. No respiratory distress.     Breath sounds: No stridor. No wheezing, rhonchi or rales.  Musculoskeletal:     Cervical back: Neck supple.  Lymphadenopathy:     Cervical: No cervical adenopathy.  Skin:    Capillary Refill: Capillary refill takes less than 2 seconds.     Coloration: Skin is not jaundiced or pale.  Neurological:     General: No focal deficit present.     Mental Status: She is alert and oriented to person, place, and time.  Psychiatric:        Behavior: Behavior normal.      UC Treatments / Results  Labs (all labs ordered are listed, but only abnormal results are displayed) Labs Reviewed  POCT RAPID STREP A, ED / UC    EKG   Radiology No results found.  Procedures Procedures (including critical care time)  Medications Ordered in UC Medications - No data to display  Initial Impression / Assessment and Plan / UC Course  I have reviewed the triage vital signs and the nursing notes.  Pertinent labs & imaging results that were available during my care of the patient were reviewed by me and considered in my medical decision making (see chart for details).        Rapid strep is done and is negative.  I intend to treat her for acute sinusitis, but I will only treat with 7 days supplies since her strep is negative.  I am not going to do throat culture since we are doing antibiotics anyway. Final Clinical Impressions(s) / UC Diagnoses   Final diagnoses:  Acute sinusitis, recurrence not specified, unspecified location  Throat pain     Discharge Instructions      The rapid strep test is negative  Take  cefdinir 300 mg--2 capsules together daily for 7 days; this is for an acute sinus infection  Take ibuprofen 800 mg--1 tab every 8 hours as needed for  pain.       ED Prescriptions     Medication Sig Dispense Auth. Provider   cefdinir (OMNICEF) 300 MG capsule Take 2 capsules (600 mg total) by mouth daily for 7 days. 14 capsule Barrett Henle, MD   ibuprofen (ADVIL) 800 MG tablet Take 1 tablet (800 mg total) by mouth every 8 (eight) hours as needed (pain). 21 tablet Durwin Davisson, Gwenlyn Perking, MD      PDMP not reviewed this encounter.   Barrett Henle, MD 10/10/22 1330

## 2022-10-10 NOTE — ED Triage Notes (Signed)
Pt states nasal congestion, headache and sore throat x 1 week. Pt is taking Motrin as needed.

## 2022-10-10 NOTE — Discharge Instructions (Signed)
The rapid strep test is negative  Take cefdinir 300 mg--2 capsules together daily for 7 days; this is for an acute sinus infection  Take ibuprofen 800 mg--1 tab every 8 hours as needed for pain.

## 2022-11-01 ENCOUNTER — Telehealth: Payer: Self-pay

## 2022-11-01 NOTE — Telephone Encounter (Signed)
LVM for patient to call back. AS, CMA 

## 2022-11-06 DIAGNOSIS — Z419 Encounter for procedure for purposes other than remedying health state, unspecified: Secondary | ICD-10-CM | POA: Diagnosis not present

## 2022-12-05 ENCOUNTER — Ambulatory Visit: Payer: Self-pay

## 2022-12-05 NOTE — Telephone Encounter (Signed)
    Chief Complaint: Headache, top of head and sides. Light sensitivity. 10/10. Asking to be seen tomorrow when she has transportation. Symptoms: Above Frequency: Last week, worse the past 2 days. Pertinent Negatives: Patient denies  Disposition: [] ED /[] Urgent Care (no appt availability in office) / [] Appointment(In office/virtual)/ []  [facility]/ [] Home Care/ [] Refused Recommended Disposition /[] [facility]/ [x]  Follow-up with PCP Additional Notes: Please advise pt.  Answer Assessment - Initial Assessment Questions 1. LOCATION: "Where does it hurt?"      Top of head and sides 2. ONSET: "When did the headache start?" (Minutes, hours or days)      Last week 3. PATTERN: "Does the pain come and go, or has it been constant since it started?"     Comes and goes, constant today 4. SEVERITY: "How bad is the pain?" and "What does it keep you from doing?"  (e.g., Scale 1-10; mild, moderate, or severe)   - MILD (1-3): doesn't interfere with normal activities    - MODERATE (4-7): interferes with normal activities or awakens from sleep    - SEVERE (8-10): excruciating pain, unable to do any normal activities        Now - 10 5. RECURRENT SYMPTOM: "Have you ever had headaches before?" If Yes, ask: "When was the last time?" and "What happened that time?"      No 6. CAUSE: "What do you think is causing the headache?"     Unsure 7. MIGRAINE: "Have you been diagnosed with migraine headaches?" If Yes, ask: "Is this headache similar?"      No 8. HEAD INJURY: "Has there been any recent injury to the head?"      No 9. OTHER SYMPTOMS: "Do you have any other symptoms?" (fever, stiff neck, eye pain, sore throat, cold symptoms)     Light 10. PREGNANCY: "Is there any chance you are pregnant?" "When was your last menstrual period?"       No  Protocols used: Headache-A-AH

## 2022-12-06 ENCOUNTER — Telehealth: Payer: Self-pay | Admitting: Family

## 2022-12-06 NOTE — Telephone Encounter (Signed)
Called pt; no answer. Replied PT on MyChart to call office to schedule.

## 2022-12-07 DIAGNOSIS — Z419 Encounter for procedure for purposes other than remedying health state, unspecified: Secondary | ICD-10-CM | POA: Diagnosis not present

## 2023-01-06 DIAGNOSIS — Z419 Encounter for procedure for purposes other than remedying health state, unspecified: Secondary | ICD-10-CM | POA: Diagnosis not present

## 2023-02-06 DIAGNOSIS — Z419 Encounter for procedure for purposes other than remedying health state, unspecified: Secondary | ICD-10-CM | POA: Diagnosis not present

## 2023-03-09 DIAGNOSIS — Z419 Encounter for procedure for purposes other than remedying health state, unspecified: Secondary | ICD-10-CM | POA: Diagnosis not present

## 2023-04-08 DIAGNOSIS — Z419 Encounter for procedure for purposes other than remedying health state, unspecified: Secondary | ICD-10-CM | POA: Diagnosis not present

## 2023-05-09 DIAGNOSIS — Z419 Encounter for procedure for purposes other than remedying health state, unspecified: Secondary | ICD-10-CM | POA: Diagnosis not present

## 2023-06-08 DIAGNOSIS — Z419 Encounter for procedure for purposes other than remedying health state, unspecified: Secondary | ICD-10-CM | POA: Diagnosis not present

## 2023-07-09 DIAGNOSIS — Z419 Encounter for procedure for purposes other than remedying health state, unspecified: Secondary | ICD-10-CM | POA: Diagnosis not present

## 2023-08-09 DIAGNOSIS — Z419 Encounter for procedure for purposes other than remedying health state, unspecified: Secondary | ICD-10-CM | POA: Diagnosis not present

## 2023-09-06 DIAGNOSIS — Z419 Encounter for procedure for purposes other than remedying health state, unspecified: Secondary | ICD-10-CM | POA: Diagnosis not present

## 2023-09-10 ENCOUNTER — Other Ambulatory Visit

## 2023-09-10 ENCOUNTER — Encounter (INDEPENDENT_AMBULATORY_CARE_PROVIDER_SITE_OTHER): Payer: Self-pay

## 2023-09-10 ENCOUNTER — Ambulatory Visit (INDEPENDENT_AMBULATORY_CARE_PROVIDER_SITE_OTHER)

## 2023-09-10 ENCOUNTER — Ambulatory Visit (INDEPENDENT_AMBULATORY_CARE_PROVIDER_SITE_OTHER): Payer: Medicaid Other | Admitting: Family

## 2023-09-10 VITALS — BP 119/81 | HR 82 | Temp 98.7°F | Ht 68.0 in | Wt 287.0 lb

## 2023-09-10 DIAGNOSIS — Z7689 Persons encountering health services in other specified circumstances: Secondary | ICD-10-CM | POA: Diagnosis not present

## 2023-09-10 DIAGNOSIS — I83813 Varicose veins of bilateral lower extremities with pain: Secondary | ICD-10-CM | POA: Diagnosis not present

## 2023-09-10 DIAGNOSIS — R635 Abnormal weight gain: Secondary | ICD-10-CM

## 2023-09-10 DIAGNOSIS — M25562 Pain in left knee: Secondary | ICD-10-CM

## 2023-09-10 DIAGNOSIS — Z6841 Body Mass Index (BMI) 40.0 and over, adult: Secondary | ICD-10-CM

## 2023-09-10 MED ORDER — TRIAMCINOLONE ACETONIDE 40 MG/ML IJ SUSP
40.0000 mg | Freq: Once | INTRAMUSCULAR | Status: AC
  Administered 2023-09-10: 40 mg via INTRAMUSCULAR

## 2023-09-10 MED ORDER — KETOROLAC TROMETHAMINE 30 MG/ML IJ SOLN
30.0000 mg | Freq: Once | INTRAMUSCULAR | Status: AC
  Administered 2023-09-10: 30 mg via INTRAMUSCULAR

## 2023-09-10 NOTE — Progress Notes (Signed)
 Patient states she fell 3 weeks ago and she fell hard on left side and a vein over there is hurting.

## 2023-09-10 NOTE — Progress Notes (Signed)
 Patient ID: Debra Bell, female    DOB: 1990/12/17  MRN: [id number]  CC: Veins/Bruising  Subjective: Debra Bell is a 33 y.o. female who presents for veins/bruising.  Her concerns today include:  - States several weeks ago she fell on her left side. States at that time it was raining and her Crocs got stuck in the grass and mud. States she did not seek immediate medical evaluation. States she did not hit her head or lose consciousness. Reports since then having left knee pain. Denies red flag symptoms. Taking over-the-counter medications with minimal relief. States no longer has bruising of skin. States discomfort of varicose veins became worse since fall. - Requests referral to weight specialist.   Patient Active Problem List   Diagnosis Date Noted   Counseling for birth control regarding intrauterine device (IUD) 06/04/2022   History of pulmonary embolism 08/03/2021     Current Outpatient Medications on File Prior to Visit  Medication Sig Dispense Refill   ibuprofen (ADVIL) 800 MG tablet Take 1 tablet (800 mg total) by mouth every 8 (eight) hours as needed (pain). (Patient not taking: Reported on 09/10/2023) 21 tablet 0   No current facility-administered medications on file prior to visit.    No Known Allergies  Social History   Socioeconomic History   Marital status: Single    Spouse name: Not on file   Number of children: Not on file   Years of education: Not on file   Highest education level: Not on file  Occupational History   Not on file  Tobacco Use   Smoking status: Never    Passive exposure: Never   Smokeless tobacco: Never  Vaping Use   Vaping status: Never Used  Substance and Sexual Activity   Alcohol use: Never   Drug use: Not Currently    Types: Marijuana   Sexual activity: Not Currently    Birth control/protection: None  Other Topics Concern   Not on file  Social History Narrative   Not on file   Social Drivers of Health   Financial  Resource Strain: Not on file  Food Insecurity: Not on file  Transportation Needs: Not on file  Physical Activity: Not on file  Stress: Not on file  Social Connections: Not on file  Intimate Partner Violence: Not on file    Family History  Problem Relation Age of Onset   Ovarian cancer Maternal Grandmother     Past Surgical History:  Procedure Laterality Date   CESAREAN SECTION     CESAREAN SECTION N/A 02/05/2021   Procedure: CESAREAN SECTION;  Surgeon: Tereso Newcomer, MD;  Location: [facility];  Service: Obstetrics;  Laterality: N/A;   OVARIAN CYST REMOVAL  2015   and 2013    ROS: Review of Systems Negative except as stated above  PHYSICAL EXAM: BP 119/81   Pulse 82   Temp 98.7 F (37.1 C) (Oral)   Ht 5\' 8"  (1.727 m)   Wt 287 lb (130.2 kg)   LMP 08/13/2023 (Exact Date)   SpO2 98%   BMI 43.64 kg/m   Physical Exam HENT:     Head: Normocephalic and atraumatic.     Nose: Nose normal.     Mouth/Throat:     Mouth: Mucous membranes are moist.     Pharynx: Oropharynx is clear.  Eyes:     Extraocular Movements: Extraocular movements intact.     Conjunctiva/sclera: Conjunctivae normal.     Pupils: Pupils are equal, round, and  reactive to light.  Cardiovascular:     Rate and Rhythm: Normal rate and regular rhythm.     Pulses: Normal pulses.     Heart sounds: Normal heart sounds.  Pulmonary:     Effort: Pulmonary effort is normal.     Breath sounds: Normal breath sounds.  Musculoskeletal:        General: Normal range of motion.     Cervical back: Normal range of motion and neck supple.     Right hip: Normal.     Left hip: Normal.     Right upper leg: Normal.     Left upper leg: Normal.     Right knee: Normal.     Left knee: Normal.     Right lower leg: Normal.     Left lower leg: Normal.     Right ankle: Normal.     Left ankle: Normal.     Right foot: Normal.     Left foot: Normal.     Comments: Varicose veins.  Neurological:     General: No focal deficit  present.     Mental Status: She is alert and oriented to person, place, and time.  Psychiatric:        Mood and Affect: Mood normal.        Behavior: Behavior normal.     ASSESSMENT AND PLAN: 1. Left knee pain, unspecified chronicity (Primary) - Triamcinolone Acetonide injection and Ketorolac injection administered. - Diagnostic xray left knee for evaluation.  - Follow-up with primary provider as scheduled. - DG Knee Complete 4 Views Left; Future - ketorolac (TORADOL) 30 MG/ML injection 30 mg - triamcinolone acetonide (KENALOG-40) injection 40 mg  2. Varicose veins of both lower extremities with pain - Vascular ultrasound lower extremity for evaluation.  - Referral to Vascular Surgery for evaluation/management. - VAS [place] LOWER EXTREMITY VENOUS (DVT); Future - Ambulatory referral to Vascular Surgery  3. Encounter for weight management 4. Weight gain 5. BMI 40.0-44.9, adult [facility]) - Referral to Medical Weight Management for evaluation/management. - Amb Ref to Medical Weight Management   Patient was given the opportunity to ask questions.  Patient verbalized understanding of the plan and was able to repeat key elements of the plan. Patient was given clear instructions to go to Emergency Department or return to medical center if symptoms don't improve, worsen, or new problems develop.The patient verbalized understanding.   Orders Placed This Encounter  Procedures   DG Knee Complete 4 Views Left   Ambulatory referral to Vascular Surgery   Amb Ref to Medical Weight Management   VAS [place] LOWER EXTREMITY VENOUS (DVT)    Follow-up with primary provider as scheduled.  Rema Fendt, NP

## 2023-09-29 ENCOUNTER — Encounter: Payer: Self-pay | Admitting: Family

## 2023-09-30 ENCOUNTER — Ambulatory Visit (HOSPITAL_COMMUNITY)
Admission: RE | Admit: 2023-09-30 | Discharge: 2023-09-30 | Disposition: A | Discharge status: Home / Self Care | Source: Ambulatory Visit | Attending: Family | Admitting: Family

## 2023-09-30 DIAGNOSIS — Z86711 Personal history of pulmonary embolism: Secondary | ICD-10-CM | POA: Diagnosis not present

## 2023-09-30 DIAGNOSIS — W19XXXD Unspecified fall, subsequent encounter: Secondary | ICD-10-CM | POA: Insufficient documentation

## 2023-09-30 DIAGNOSIS — S8012XD Contusion of left lower leg, subsequent encounter: Secondary | ICD-10-CM | POA: Insufficient documentation

## 2023-09-30 DIAGNOSIS — I83813 Varicose veins of bilateral lower extremities with pain: Secondary | ICD-10-CM | POA: Insufficient documentation

## 2023-09-30 DIAGNOSIS — M79662 Pain in left lower leg: Secondary | ICD-10-CM | POA: Diagnosis not present

## 2023-10-01 ENCOUNTER — Encounter: Payer: Self-pay | Admitting: Family

## 2023-10-18 DIAGNOSIS — Z419 Encounter for procedure for purposes other than remedying health state, unspecified: Secondary | ICD-10-CM | POA: Diagnosis not present

## 2023-11-17 DIAGNOSIS — Z419 Encounter for procedure for purposes other than remedying health state, unspecified: Secondary | ICD-10-CM | POA: Diagnosis not present

## 2023-12-02 ENCOUNTER — Emergency Department (HOSPITAL_COMMUNITY)
Admission: EM | Admit: 2023-12-02 | Discharge: 2023-12-02 | Disposition: A | Discharge status: Home / Self Care | Attending: Emergency Medicine | Admitting: Emergency Medicine

## 2023-12-02 ENCOUNTER — Other Ambulatory Visit: Payer: Self-pay

## 2023-12-02 DIAGNOSIS — K0889 Other specified disorders of teeth and supporting structures: Secondary | ICD-10-CM | POA: Diagnosis not present

## 2023-12-02 DIAGNOSIS — K029 Dental caries, unspecified: Secondary | ICD-10-CM | POA: Insufficient documentation

## 2023-12-02 LAB — CBC
HCT: 40.8 % (ref 36.0–46.0)
Hemoglobin: 12.5 g/dL (ref 12.0–15.0)
MCH: 26 pg (ref 26.0–34.0)
MCHC: 30.6 g/dL (ref 30.0–36.0)
MCV: 84.8 fL (ref 80.0–100.0)
Platelets: 289 10*3/uL (ref 150–400)
RBC: 4.81 MIL/uL (ref 3.87–5.11)
RDW: 15.3 % (ref 11.5–15.5)
WBC: 5.8 10*3/uL (ref 4.0–10.5)
nRBC: 0 % (ref 0.0–0.2)

## 2023-12-02 LAB — BASIC METABOLIC PANEL WITH GFR
Anion gap: 12 (ref 5–15)
BUN: <5 mg/dL — ABNORMAL LOW (ref 6–20)
CO2: 22 mmol/L (ref 22–32)
Calcium: 9.3 mg/dL (ref 8.9–10.3)
Chloride: 104 mmol/L (ref 98–111)
Creatinine, Ser: 0.71 mg/dL (ref 0.44–1.00)
GFR, Estimated: >60 mL/min (ref >60)
Glucose, Bld: 93 mg/dL (ref 70–99)
Potassium: 3.5 mmol/L (ref 3.5–5.1)
Sodium: 138 mmol/L (ref 135–145)

## 2023-12-02 MED ORDER — HYDROCODONE-ACETAMINOPHEN 5-325 MG PO TABS: 2.0000 | ORAL_TABLET | ORAL | 0 refills | Status: AC | PRN

## 2023-12-02 MED ORDER — ONDANSETRON 4 MG PO TBDP
4.0000 mg | ORAL_TABLET | Freq: Four times a day (QID) | ORAL | 0 refills | Status: AC | PRN
Start: 2023-12-02 — End: ?

## 2023-12-02 MED ORDER — HYDROCODONE-ACETAMINOPHEN 5-325 MG PO TABS
1.0000 | ORAL_TABLET | Freq: Once | ORAL | Status: AC
  Administered 2023-12-02: 1 via ORAL
  Filled 2023-12-02: qty 1

## 2023-12-02 NOTE — Discharge Instructions (Addendum)
 Is a pleasure taking care of you today.  You were seen for pain due to broken tooth and dental caries.  Continue the antibiotics prescribed to you by your dentist.  You can continue taking the ibuprofen  800 mg as prescribed.  I gave you a small amount of Norco for when pain is more severe and not controlled by the ibuprofen  and Zofran  to use as needed for nausea.  Come back if you have fever or chills, trouble swallowing or breathing, difficulty opening your mouth or any other worrisome changes.  You were prescribed opiate pain medication. This can have many sided effect such as drowsiness, slowed breathing, and conspitation. Do not take it with other medications that cause drowsiness, or drink alcohol while taking it. Take a stool softener over the counter while taking it.

## 2023-12-02 NOTE — ED Triage Notes (Signed)
 Pt states 4 months ago cracked back bottom L molar; saw dentist, states would not pull tooth due to infection, given abx; c/o ha and tooth ache x past 3 days, emesis x 1 day; took ibuprofen  this am

## 2023-12-03 NOTE — ED Provider Notes (Signed)
 [place] EMERGENCY DEPARTMENT AT [facility] Provider Note   CSN: [id number] Arrival date & time: 12/02/23  1803     History  No chief complaint on file.   Debra Bell is a 33 y.o. female.  She seen her dentist today for infection in the left lower molar, she is a known broken tooth in this area.  They sent her home with ibuprofen  100 mg and antibiotics.  She states she did take ibuprofen  at home and Tylenol  without relief also having some nausea related to the pain, denies fever or chills, no trouble swallowing or breathing or difficulty opening her mouth.  Primarily here for pain control today as she is already follow-up with her dentist to ultimately take care of the tooth. HPI     Home Medications Prior to Admission medications   Medication Sig Start Date End Date Taking? Authorizing Provider  HYDROcodone-acetaminophen  (NORCO/VICODIN) 5-325 MG tablet Take 2 tablets by mouth every 4 (four) hours as needed. 12/02/23  Yes Vernita Tague A, PA-C  ondansetron  (ZOFRAN -ODT) 4 MG disintegrating tablet Take 1 tablet (4 mg total) by mouth every 6 (six) hours as needed for nausea or vomiting. 12/02/23  Yes Brizza Nathanson A, PA-C  ibuprofen  (ADVIL ) 800 MG tablet Take 1 tablet (800 mg total) by mouth every 8 (eight) hours as needed (pain). Patient not taking: Reported on 09/10/2023 10/10/22   Ann Keto, MD      Allergies    Patient has no known allergies.    Review of Systems   Review of Systems  Physical Exam Updated Vital Signs BP (!) 143/106 (BP Location: Right Arm)   Pulse 82   Temp 98.6 F (37 C) (Oral)   Resp 18   SpO2 100%  Physical Exam Vitals and nursing note reviewed.  Constitutional:      General: She is not in acute distress.    Appearance: She is well-developed.  HENT:     Head: Normocephalic and atraumatic.     Comments: Speaks in full and clear sentences, handles oral secretions well, no trismus, tenderness to left lower molar with  broken tooth here.  No drainable collection.    Mouth/Throat:     Mouth: Mucous membranes are moist.  Eyes:     Conjunctiva/sclera: Conjunctivae normal.  Cardiovascular:     Rate and Rhythm: Normal rate and regular rhythm.     Heart sounds: No murmur heard. Pulmonary:     Effort: Pulmonary effort is normal. No respiratory distress.     Breath sounds: Normal breath sounds.  Abdominal:     Palpations: Abdomen is soft.     Tenderness: There is no abdominal tenderness.  Musculoskeletal:        General: No swelling.     Cervical back: Neck supple.  Skin:    General: Skin is warm and dry.     Capillary Refill: Capillary refill takes less than 2 seconds.  Neurological:     General: No focal deficit present.     Mental Status: She is alert.  Psychiatric:        Mood and Affect: Mood normal.     ED Results / Procedures / Treatments   Labs (all labs ordered are listed, but only abnormal results are displayed) Labs Reviewed  BASIC METABOLIC PANEL WITH GFR - Abnormal; Notable for the following components:      Result Value   BUN <5 (*)    All other components within normal limits  CBC  EKG None  Radiology No results found.  Procedures Procedures    Medications Ordered in ED Medications  HYDROcodone-acetaminophen  (NORCO/VICODIN) 5-325 MG per tablet 1 tablet (1 tablet Oral Given 12/02/23 1915)    ED Course/ Medical Decision Making/ A&P                                 Medical Decision Making Ddx: dental abscess, dental caries, dental fracture, alveolar osteitis, ludwigs angina, other ED course: Patient present with pain in the molar. They speak in full and clear sentences,  handle their oral secretions well. There is no sign of deep space abscess. There is no drainable collection.  Pain management: Pt given norco for pain control Antibiotics provided for dental infection by her dentist already, advised to come back to the ED for any new or worsening  symptoms.   Risk Prescription drug management.           Final Clinical Impression(s) / ED Diagnoses Final diagnoses:  Pain due to dental caries    Rx / DC Orders ED Discharge Orders          Ordered    HYDROcodone-acetaminophen  (NORCO/VICODIN) 5-325 MG tablet  Every 4 hours PRN        12/02/23 1933    ondansetron  (ZOFRAN -ODT) 4 MG disintegrating tablet  Every 6 hours PRN        12/02/23 1934              Joshua Nieves 12/03/23 1346    Tegeler, Marine Sia, MD 12/03/23 1505

## 2023-12-18 DIAGNOSIS — Z419 Encounter for procedure for purposes other than remedying health state, unspecified: Secondary | ICD-10-CM | POA: Diagnosis not present

## 2024-01-29 ENCOUNTER — Ambulatory Visit (INDEPENDENT_AMBULATORY_CARE_PROVIDER_SITE_OTHER)

## 2024-01-29 DIAGNOSIS — N926 Irregular menstruation, unspecified: Secondary | ICD-10-CM

## 2024-01-29 LAB — POCT URINE PREGNANCY: Preg Test, Ur: NEGATIVE

## 2024-01-29 NOTE — Progress Notes (Signed)
 Debra Bell here for a UPT. Pt had a missed period. LMP is approximately mid/end of June.     UPT in office Negative.   Pt is going to schedule visit for [facility] at checkout.

## 2024-02-10 ENCOUNTER — Ambulatory Visit

## 2024-02-10 ENCOUNTER — Other Ambulatory Visit

## 2024-02-10 DIAGNOSIS — N926 Irregular menstruation, unspecified: Secondary | ICD-10-CM

## 2024-02-11 ENCOUNTER — Ambulatory Visit: Payer: Self-pay | Admitting: Obstetrics and Gynecology

## 2024-02-11 LAB — BETA HCG QUANT (REF LAB): hCG Quant: <1 m[IU]/mL

## 2024-02-27 ENCOUNTER — Ambulatory Visit: Admitting: Obstetrics and Gynecology

## 2024-07-12 ENCOUNTER — Encounter (HOSPITAL_COMMUNITY): Payer: Self-pay | Admitting: Emergency Medicine

## 2024-07-12 ENCOUNTER — Emergency Department (HOSPITAL_COMMUNITY)
Admission: EM | Admit: 2024-07-12 | Discharge: 2024-07-13 | Discharge status: Against Medical Advice | Attending: Emergency Medicine | Admitting: Emergency Medicine

## 2024-07-12 ENCOUNTER — Emergency Department (HOSPITAL_COMMUNITY)

## 2024-07-12 ENCOUNTER — Other Ambulatory Visit: Payer: Self-pay

## 2024-07-12 DIAGNOSIS — M25562 Pain in left knee: Secondary | ICD-10-CM | POA: Diagnosis present

## 2024-07-12 DIAGNOSIS — W010XXA Fall on same level from slipping, tripping and stumbling without subsequent striking against object, initial encounter: Secondary | ICD-10-CM | POA: Diagnosis not present

## 2024-07-12 DIAGNOSIS — Z5321 Procedure and treatment not carried out due to patient leaving prior to being seen by health care provider: Secondary | ICD-10-CM | POA: Diagnosis not present

## 2024-07-12 NOTE — ED Triage Notes (Signed)
 Pt presents with L. Knee pain from a fall yesterday. It feels worse today so she wants to get it checked out.

## 2024-07-12 NOTE — ED Provider Triage Note (Signed)
 Emergency Medicine Provider Triage Evaluation Note  Debra Bell , a 34 y.o. female  was evaluated in triage.  Pt complains of left knee pain after mechanical fall yesterday, noted to have been walking at [employer] where she slipped, landing on her left knee.  Did not hit head, did not lose consciousness.  Was able to ambulate after the incident.  However came in today due to persistent pain.  Noted to be swollen last night but says that swelling has since improved.  However has had pain with range of motion at the knee.  Denies numbness, weakness, tingling.  Review of Systems  Positive: N/a Negative: N/a  Physical Exam  BP 134/80   Pulse 89   Temp 97.8 F (36.6 C)   Resp 16   SpO2 100%  Gen:   Awake, no distress   Resp:  Normal effort  MSK:   Moves extremities without difficulty  Other:  No swelling noted to the knee.  Does have tenderness over patella.  Medical Decision Making  Medically screening exam initiated at 8:19 PM.  Appropriate orders placed.  Debra Bell was informed that the remainder of the evaluation will be completed by another provider, this initial triage assessment does not replace that evaluation, and the importance of remaining in the ED until their evaluation is complete.     Debra Bell, [place] 07/12/24 2020

## 2024-07-13 NOTE — ED Notes (Signed)
 PT left AMA
